# Patient Record
Sex: Male | Born: 1985 | Race: White | Hispanic: No | Marital: Married | Smoking: Current every day smoker
Health system: Southern US, Community
[De-identification: ages and names within clinical notes are randomized; demographics above are authoritative.]

## PROBLEM LIST (undated history)

## (undated) HISTORY — PX: MANDIBLE SURGERY: SHX707

## (undated) HISTORY — PX: TONSILLECTOMY: SUR1361

---
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
## 2011-03-30 ENCOUNTER — Emergency Department: Payer: Self-pay | Admitting: Emergency Medicine
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
## 2014-02-18 ENCOUNTER — Emergency Department: Payer: Self-pay | Admitting: Emergency Medicine

## 2014-03-15 ENCOUNTER — Emergency Department: Payer: Self-pay | Admitting: Emergency Medicine

## 2014-07-08 ENCOUNTER — Emergency Department: Payer: Self-pay | Admitting: Emergency Medicine

## 2014-07-09 ENCOUNTER — Emergency Department: Payer: Self-pay | Admitting: Internal Medicine

## 2014-08-13 ENCOUNTER — Emergency Department: Payer: Self-pay | Admitting: Emergency Medicine

## 2015-07-17 ENCOUNTER — Emergency Department
Admission: EM | Admit: 2015-07-17 | Discharge: 2015-07-18 | Disposition: A | Discharge status: Home / Self Care | Payer: Medicaid Other | Attending: Emergency Medicine | Admitting: Emergency Medicine

## 2015-07-17 ENCOUNTER — Emergency Department: Payer: Medicaid Other

## 2015-07-17 DIAGNOSIS — Z720 Tobacco use: Secondary | ICD-10-CM | POA: Diagnosis not present

## 2015-07-17 DIAGNOSIS — W1839XA Other fall on same level, initial encounter: Secondary | ICD-10-CM | POA: Insufficient documentation

## 2015-07-17 DIAGNOSIS — Z880 Allergy status to penicillin: Secondary | ICD-10-CM | POA: Diagnosis not present

## 2015-07-17 DIAGNOSIS — Y9361 Activity, american tackle football: Secondary | ICD-10-CM | POA: Diagnosis not present

## 2015-07-17 DIAGNOSIS — Y998 Other external cause status: Secondary | ICD-10-CM | POA: Insufficient documentation

## 2015-07-17 DIAGNOSIS — S93401A Sprain of unspecified ligament of right ankle, initial encounter: Secondary | ICD-10-CM | POA: Diagnosis not present

## 2015-07-17 DIAGNOSIS — Y92321 Football field as the place of occurrence of the external cause: Secondary | ICD-10-CM | POA: Insufficient documentation

## 2015-07-17 DIAGNOSIS — S99911A Unspecified injury of right ankle, initial encounter: Secondary | ICD-10-CM | POA: Diagnosis present

## 2015-07-17 MED ORDER — OXYCODONE-ACETAMINOPHEN 5-325 MG PO TABS
1.0000 | ORAL_TABLET | Freq: Once | ORAL | Status: AC
  Administered 2015-07-17: 1 via ORAL

## 2015-07-17 MED ORDER — OXYCODONE-ACETAMINOPHEN 5-325 MG PO TABS: 1.0000 | ORAL_TABLET | Freq: Four times a day (QID) | ORAL | Status: AC | PRN

## 2015-07-17 MED ORDER — OXYCODONE-ACETAMINOPHEN 5-325 MG PO TABS
ORAL_TABLET | ORAL | Status: AC
  Administered 2015-07-17: 1 via ORAL
  Filled 2015-07-17: qty 1

## 2015-07-17 MED ORDER — IBUPROFEN 800 MG PO TABS
800.0000 mg | ORAL_TABLET | Freq: Once | ORAL | Status: AC
  Administered 2015-07-17: 800 mg via ORAL
  Filled 2015-07-17: qty 1

## 2015-07-17 MED ORDER — NAPROXEN 500 MG PO TABS
500.0000 mg | ORAL_TABLET | Freq: Two times a day (BID) | ORAL | Status: AC | PRN
Start: 2015-07-17 — End: 2016-07-16

## 2015-07-17 NOTE — ED Provider Notes (Signed)
[facility] Emergency Department Provider Note  ____________________________________________  Time seen: Seen upon arrival to the emergency department.  I have reviewed the triage vital signs and the nursing notes.   HISTORY  Chief Complaint Ankle Injury    HPI Kevin Duarte is a 29 y.o. male without any chronic medical conditions who presents today after inverting and then everting his right ankle while playing football. He reports pain to the ankle which is greatest to the lateral aspect. He did not hit his head or lose consciousness when he fell. He does admit to drinking 2x40 ounce drinks prior to arrival this evening.   History reviewed. No pertinent past medical history.  There are no active problems to display for this patient.   Past Surgical History  Procedure Laterality Date  . Mandible surgery      Left Mandible    No current outpatient prescriptions on file.  Allergies Amoxicillin and Penicillins  History reviewed. No pertinent family history.  Social History History  Substance Use Topics  . Smoking status: Current Every Day Smoker  . Smokeless tobacco: Never Used  . Alcohol Use: Yes    Review of Systems Constitutional: No fever/chills Eyes: No visual changes. ENT: No sore throat. Cardiovascular: Denies chest pain. Respiratory: Denies shortness of breath. Gastrointestinal: No abdominal pain.  No nausea, no vomiting.  No diarrhea.  No constipation. Genitourinary: Negative for dysuria. Musculoskeletal: Right ankle pain  Skin: Negative for rash. Neurological: Negative for headaches, focal weakness or numbness.  10-point ROS otherwise negative.  ____________________________________________   PHYSICAL EXAM:  VITAL SIGNS: ED Triage Vitals  Enc Vitals Group     BP 07/17/15 2200 141/101 mmHg     Pulse Rate 07/17/15 2200 114     Resp 07/17/15 2200 21     Temp --      Temp Source 07/17/15 2200 Oral     SpO2 07/17/15 2200  96 %     Weight --      Height --      Head Cir --      Peak Flow --      Pain Score 07/17/15 2201 7     Pain Loc --      Pain Edu? --      Excl. in GC? --     Constitutional: Alert and oriented. Well appearing and in no acute distress. Eyes: Conjunctivae are normal. PERRL. EOMI. Head: Atraumatic. Nose: No congestion/rhinnorhea. Mouth/Throat: Mucous membranes are moist.  Oropharynx non-erythematous. Neck: No stridor.   Cardiovascular: Normal rate, regular rhythm. Grossly normal heart sounds.  Good peripheral circulation. Respiratory: Normal respiratory effort.  No retractions. Lungs CTAB. Gastrointestinal: Soft and nontender. No distention. No abdominal bruits. No CVA tenderness. Musculoskeletal: Right lateral ankle with swelling over the lateral malleolus. Patient does have pain with active range of motion.  No joint effusions. No tenderness to the bones of the foot. Able to range his toes. Dorsalis pedis pulses intact. Sensation intact. Brisk capillary refill distal to the injury. Neurologic:  Normal speech and language. No gross focal neurologic deficits are appreciated. No gait instability. Skin:  Skin is warm, dry and intact. No rash noted. Psychiatric: Mood and affect are normal. Speech and behavior are normal.  ____________________________________________   LABS (all labs ordered are listed, but only abnormal results are displayed)  Labs Reviewed - No data to display ____________________________________________  EKG   ____________________________________________  RADIOLOGY  Negative for fracture. ____________________________________________   PROCEDURES    ____________________________________________  INITIAL IMPRESSION / ASSESSMENT AND PLAN / ED COURSE  Pertinent labs & imaging results that were available during my care of the patient were reviewed by me and considered in my medical decision making (see chart for  details).  ----------------------------------------- 11:18 PM on 07/17/2015 -----------------------------------------  Patient resting comfortably now after ibuprofen and Percocet. Discussed the radiology results with the patient. Understands that he does not have a fracture. Understands that he will keep the extremity iced and elevated. We will give him a splint because of the pain from the fracture. We will also give him crutches to go home with. He is not clinically intoxicated and appropriate for discharge. ____________________________________________   FINAL CLINICAL IMPRESSION(S) / ED DIAGNOSES  Acute right ankle sprain. Initial visit.    Myrna Blazer, MD 07/17/15 [phone]

## 2015-07-17 NOTE — Discharge Instructions (Signed)
Ankle Sprain  An ankle sprain is an injury to the strong, fibrous tissues (ligaments) that hold your ankle bones together.   HOME CARE   · Put ice on your ankle for 1-2 days or as told by your doctor.  ¨ Put ice in a plastic bag.  ¨ Place a towel between your skin and the bag.  ¨ Leave the ice on for 15-20 minutes at a time, every 2 hours while you are awake.  · Only take medicine as told by your doctor.  · Raise (elevate) your injured ankle above the level of your heart as much as possible for 2-3 days.  · Use crutches if your doctor tells you to. Slowly put your own weight on the affected ankle. Use the crutches until you can walk without pain.  · If you have a plaster splint:  ¨ Do not rest it on anything harder than a pillow for 24 hours.  ¨ Do not put weight on it.  ¨ Do not get it wet.  ¨ Take it off to shower or bathe.  · If given, use an elastic wrap or support stocking for support. Take the wrap off if your toes lose feeling (numb), tingle, or turn cold or blue.  · If you have an air splint:  ¨ Add or let out air to make it comfortable.  ¨ Take it off at night and to shower and bathe.  ¨ Wiggle your toes and move your ankle up and down often while you are wearing it.  GET HELP IF:  · You have rapidly increasing bruising or puffiness (swelling).  · Your toes feel very cold.  · You lose feeling in your foot.  · Your medicine does not help your pain.  GET HELP RIGHT AWAY IF:   · Your toes lose feeling (numb) or turn blue.  · You have severe pain that is increasing.  MAKE SURE YOU:   · Understand these instructions.  · Will watch your condition.  · Will get help right away if you are not doing well or get worse.  Document Released: 05/20/2008 Document Revised: 04/18/2014 Document Reviewed: 06/15/2012  [employer]® Patient Information ©2015 [employer]. This information is not intended to replace advice given to you by your health care provider. Make sure you discuss any questions you have with your health care  provider.

## 2015-07-17 NOTE — ED Notes (Signed)
Ice Pack is place on Right Ankle and elevated on two pillows.

## 2015-07-17 NOTE — ED Notes (Signed)
According to pt, he was playing Football outside when he "rolled" his right ankle. Upon arrival to ED, obvious right ankle deformity observed. Pt A+OX4 and rates right ankle pain at 7/10.

## 2016-05-10 ENCOUNTER — Encounter: Payer: Self-pay | Admitting: Emergency Medicine

## 2016-05-10 DIAGNOSIS — F1219 Cannabis abuse with unspecified cannabis-induced disorder: Secondary | ICD-10-CM | POA: Diagnosis not present

## 2016-05-10 DIAGNOSIS — F17210 Nicotine dependence, cigarettes, uncomplicated: Secondary | ICD-10-CM | POA: Diagnosis not present

## 2016-05-10 DIAGNOSIS — R45851 Suicidal ideations: Secondary | ICD-10-CM | POA: Diagnosis present

## 2016-05-10 DIAGNOSIS — R457 State of emotional shock and stress, unspecified: Secondary | ICD-10-CM | POA: Insufficient documentation

## 2016-05-10 DIAGNOSIS — F329 Major depressive disorder, single episode, unspecified: Secondary | ICD-10-CM | POA: Insufficient documentation

## 2016-05-10 NOTE — ED Notes (Signed)
Pt presents to ED with suicidal thoughts (no plan). Previous hx of suicide attempt several years ago. Has been "batteling suicidal thoughts for many years" but the past 3 days it has become worse. Pt states he was seen by pcp but was not given any resources for help. Pt feels like his doctor just overlooked him and his problems because she has so many people to take care of.

## 2016-05-11 ENCOUNTER — Emergency Department
Admission: EM | Admit: 2016-05-11 | Discharge: 2016-05-11 | Disposition: A | Discharge status: Home / Self Care | Payer: Medicaid Other | Attending: Emergency Medicine | Admitting: Emergency Medicine

## 2016-05-11 DIAGNOSIS — F32A Depression, unspecified: Secondary | ICD-10-CM

## 2016-05-11 DIAGNOSIS — F329 Major depressive disorder, single episode, unspecified: Secondary | ICD-10-CM

## 2016-05-11 DIAGNOSIS — F4320 Adjustment disorder, unspecified: Secondary | ICD-10-CM

## 2016-05-11 LAB — CBC WITH DIFFERENTIAL/PLATELET
Basophils Absolute: 0.1 10*3/uL (ref 0–0.1)
Basophils Relative: 1 %
EOS ABS: 0.2 10*3/uL (ref 0–0.7)
Eosinophils Relative: 2 %
HCT: 51.4 % (ref 40.0–52.0)
HEMOGLOBIN: 17.9 g/dL (ref 13.0–18.0)
Lymphocytes Relative: 40 %
Lymphs Abs: 4 10*3/uL — ABNORMAL HIGH (ref 1.0–3.6)
MCH: 33.4 pg (ref 26.0–34.0)
MCHC: 34.8 g/dL (ref 32.0–36.0)
MCV: 96 fL (ref 80.0–100.0)
MONO ABS: 0.7 10*3/uL (ref 0.2–1.0)
MONOS PCT: 7 %
NEUTROS ABS: 4.9 10*3/uL (ref 1.4–6.5)
Neutrophils Relative %: 50 %
Platelets: 187 10*3/uL (ref 150–440)
RBC: 5.36 MIL/uL (ref 4.40–5.90)
RDW: 13.5 % (ref 11.5–14.5)
WBC: 10 10*3/uL (ref 3.8–10.6)

## 2016-05-11 LAB — COMPREHENSIVE METABOLIC PANEL
ALK PHOS: 56 U/L (ref 38–126)
ALT: 30 U/L (ref 17–63)
ANION GAP: 11 (ref 5–15)
AST: 25 U/L (ref 15–41)
Albumin: 4.9 g/dL (ref 3.5–5.0)
BUN: 11 mg/dL (ref 6–20)
CALCIUM: 9.3 mg/dL (ref 8.9–10.3)
CHLORIDE: 104 mmol/L (ref 101–111)
CO2: 25 mmol/L (ref 22–32)
Creatinine, Ser: 0.74 mg/dL (ref 0.61–1.24)
GFR calc non Af Amer: >60 mL/min (ref >60)
GLUCOSE: 99 mg/dL (ref 65–99)
POTASSIUM: 3.2 mmol/L — AB (ref 3.5–5.1)
SODIUM: 140 mmol/L (ref 135–145)
Total Bilirubin: 0.8 mg/dL (ref 0.3–1.2)
Total Protein: 7.7 g/dL (ref 6.5–8.1)

## 2016-05-11 LAB — URINE DRUG SCREEN, QUALITATIVE (ARMC ONLY)
Amphetamines, Ur Screen: NOT DETECTED
BARBITURATES, UR SCREEN: NOT DETECTED
Benzodiazepine, Ur Scrn: NOT DETECTED
COCAINE METABOLITE, UR ~~LOC~~: NOT DETECTED
Cannabinoid 50 Ng, Ur ~~LOC~~: NOT DETECTED
MDMA (Ecstasy)Ur Screen: NOT DETECTED
Methadone Scn, Ur: NOT DETECTED
OPIATE, UR SCREEN: NOT DETECTED
Phencyclidine (PCP) Ur S: NOT DETECTED
Tricyclic, Ur Screen: NOT DETECTED

## 2016-05-11 LAB — ETHANOL: Alcohol, Ethyl (B): 184 mg/dL — ABNORMAL HIGH (ref <5)

## 2016-05-11 LAB — ACETAMINOPHEN LEVEL: Acetaminophen (Tylenol), Serum: <10 ug/mL — ABNORMAL LOW (ref 10–30)

## 2016-05-11 LAB — SALICYLATE LEVEL: Salicylate Lvl: <4 mg/dL (ref 2.8–30.0)
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
## 2016-05-11 NOTE — Discharge Instructions (Signed)
Major Depressive Disorder °Major depressive disorder is a mental illness. It also may be called clinical depression or unipolar depression. Major depressive disorder usually causes feelings of sadness, hopelessness, or helplessness. Some people with this disorder do not feel particularly sad but lose interest in doing things they used to enjoy (anhedonia). Major depressive disorder also can cause physical symptoms. It can interfere with work, school, relationships, and other normal everyday activities. The disorder varies in severity but is longer lasting and more serious than the sadness we all feel from time to time in our lives. °Major depressive disorder often is triggered by stressful life events or major life changes. Examples of these triggers include divorce, loss of your job or home, a move, and the death of a family member or close friend. Sometimes this disorder occurs for no obvious reason at all. People who have family members with major depressive disorder or bipolar disorder are at higher risk for developing this disorder, with or without life stressors. Major depressive disorder can occur at any age. It may occur just once in your life (single episode major depressive disorder). It may occur multiple times (recurrent major depressive disorder). °SYMPTOMS °People with major depressive disorder have either anhedonia or depressed mood on nearly a daily basis for at least 2 weeks or longer. Symptoms of depressed mood include: °· Feelings of sadness (blue or down in the dumps) or emptiness. °· Feelings of hopelessness or helplessness. °· Tearfulness or episodes of crying (may be observed by others). °· Irritability (children and adolescents). °In addition to depressed mood or anhedonia or both, people with this disorder have at least four of the following symptoms: °· Difficulty sleeping or sleeping too much.   °· Significant change (increase or decrease) in appetite or weight.   °· Lack of energy or  motivation. °· Feelings of guilt and worthlessness.   °· Difficulty concentrating, remembering, or making decisions. °· Unusually slow movement (psychomotor retardation) or restlessness (as observed by others).   °· Recurrent wishes for death, recurrent thoughts of self-harm (suicide), or a suicide attempt. °People with major depressive disorder commonly have persistent negative thoughts about themselves, other people, and the world. People with severe major depressive disorder may experience distorted beliefs or perceptions about the world (psychotic delusions). They also may see or hear things that are not real (psychotic hallucinations). °DIAGNOSIS °Major depressive disorder is diagnosed through an assessment by your health care provider. Your health care provider will ask about aspects of your daily life, such as mood, sleep, and appetite, to see if you have the diagnostic symptoms of major depressive disorder. Your health care provider may ask about your medical history and use of alcohol or drugs, including prescription medicines. Your health care provider also may do a physical exam and blood work. This is because certain medical conditions and the use of certain substances can cause major depressive disorder-like symptoms (secondary depression). Your health care provider also may refer you to a mental health specialist for further evaluation and treatment. °TREATMENT °It is important to recognize the symptoms of major depressive disorder and seek treatment. The following treatments can be prescribed for this disorder:   °· Medicine. Antidepressant medicines usually are prescribed. Antidepressant medicines are thought to correct chemical imbalances in the brain that are commonly associated with major depressive disorder. Other types of medicine may be added if the symptoms do not respond to antidepressant medicines alone or if psychotic delusions or hallucinations occur. °· Talk therapy. Talk therapy can be  helpful in treating major depressive disorder by providing   support, education, and guidance. Certain types of talk therapy also can help with negative thinking (cognitive behavioral therapy) and with relationship issues that trigger this disorder (interpersonal therapy). A mental health specialist can help determine which treatment is best for you. Most people with major depressive disorder do well with a combination of medicine and talk therapy. Treatments involving electrical stimulation of the brain can be used in situations with extremely severe symptoms or when medicine and talk therapy do not work over time. These treatments include electroconvulsive therapy, transcranial magnetic stimulation, and vagal nerve stimulation.   This information is not intended to replace advice given to you by your health care provider. Make sure you discuss any questions you have with your health care provider.   Document Released: 03/29/2013 Document Revised: 12/23/2014 Document Reviewed: 03/29/2013 Elsevier Interactive Patient Education 2016 [employer].  Suicidal Feelings: How to Help Yourself Suicide is the taking of one's own life. If you feel as though life is getting too tough to handle and are thinking about suicide, get help right away. To get help:  Call your local emergency services (911 in the [place]  Call a suicide hotline to speak with a trained counselor who understands how you are feeling. The following is a list of suicide hotlines in the [place]. For a list of hotlines in [place], visit InkDistributor.it.  1-800-273-TALK 401-084-4920).  1-800-SUICIDE 781-546-5187).  580 608 6789. This is a hotline for Spanish speakers.  4-132-440-1UUV 239-726-8785). This is a hotline for TTY users.  1-866-4-U-TREVOR (972)835-3241). This is a hotline for lesbian, gay, bisexual, transgender, or questioning youth.  Contact a crisis  center or a local suicide prevention center. To find a crisis center or suicide prevention center:  Call your local hospital, clinic, community service organization, mental health center, social service provider, or health department. Ask for assistance in connecting to a crisis center.  Visit https://www.patel-king.com/ for a list of crisis centers in the [place], or visit www.suicideprevention.ca/thinking-about-suicide/find-a-crisis-centre for a list of centers in [place].  Visit the following websites:  National Suicide Prevention Lifeline: www.suicidepreventionlifeline.org  Hopeline: www.hopeline.com  [employer] for Suicide Prevention: https://www.ayers.com/  [employer] (for lesbian, gay, bisexual, transgender, or questioning youth): www.thetrevorproject.org HOW CAN I HELP MYSELF FEEL BETTER?  Promise yourself that you will not do anything drastic when you have suicidal feelings. Remember, there is hope. Many people have gotten through suicidal thoughts and feelings, and you will, too. You may have gotten through them before, and this proves that you can get through them again.  Let family, friends, teachers, or counselors know how you are feeling. Try not to isolate yourself from those who care about you. Remember, they will want to help you. Talk with someone every day, even if you do not feel sociable. Face-to-face conversation is best.  Call a mental health professional and see one regularly.  Visit your primary health care provider every year.  Eat a well-balanced diet, and space your meals so you eat regularly.  Get plenty of rest.  Avoid alcohol and drugs, and remove them from your home. They will only make you feel worse.  If you are thinking of taking a lot of medicine, give your medicine to someone who can give it to you one day at a time. If you are on antidepressants and are concerned you will overdose, let your health care  provider know so he or she can give you safer medicines. Ask your mental health professional about the possible side effects of any  medicines you are taking.  Remove weapons, poisons, knives, and anything else that could harm you from your home.  Try to stick to routines. Follow a schedule every day. Put self-care on your schedule.  Make a list of realistic goals, and cross them off when you achieve them. Accomplishments give a sense of worth.  Wait until you are feeling better before doing the things you find difficult or unpleasant.  Exercise if you are able. You will feel better if you exercise for even a half hour each day.  Go out in the sun or into nature. This will help you recover from depression faster. If you have a favorite place to walk, go there.  Do the things that have always given you pleasure. Play your favorite music, read a good book, paint a picture, play your favorite instrument, or do anything else that takes your mind off your depression if it is safe to do.  Keep your living space well lit.  When you are feeling well, write yourself a letter about tips and support that you can read when you are not feeling well.  Remember that life's difficulties can be sorted out with help. Conditions can be treated. You can work on thoughts and strategies that serve you well.   This information is not intended to replace advice given to you by your health care provider. Make sure you discuss any questions you have with your health care provider.   Document Released: 06/08/2003 Document Revised: 12/23/2014 Document Reviewed: 03/29/2014 Elsevier Interactive Patient Education 2016 [employer]  Adjustment Disorder Adjustment disorder is an unusually severe reaction to a stressful life event, such as the loss of a job or physical illness. The event may be any stressful event other than the loss of a loved one. Adjustment disorder may affect your feelings, your thinking, how you  act, or a combination of these. It may interfere with personal relationships or with the way you are at work, school, or home. People with this disorder are at risk for suicide and substance abuse. They may develop a more serious mental disorder, such as major depressive disorder or post-traumatic stress disorder. SIGNS AND SYMPTOMS  Symptoms may include:  Sadness, depressed mood, or crying spells.  Loss of enjoyment.  Change in appetite or weight.  Sense of loss or hopelessness.  Thoughts of suicide.  Anxiety, worry, or nervousness.  Trouble sleeping.  Avoiding family and friends.  Poor school performance.  Fighting or vandalism.  Reckless driving.  Skipping school.  Poor work International aid/development workerperformance.  Ignoring bills. Symptoms of adjustment disorder start within 3 months of the stressful life event. They do not last more than 6 months after the event has ended. DIAGNOSIS  To make a diagnosis, your health care provider will ask about what has happened in your life and how it has affected you. He or she may also ask about your medical history and use of medicines, alcohol, and other substances. Your health care provider may do a physical exam and order lab tests or other studies. You may be referred to a mental health specialist for evaluation. TREATMENT  Treatment options include:  Counseling or talk therapy. Talk therapy is usually provided by mental health specialists.  Medicine. Certain medicines may help with depression, anxiety, and sleep.  Support groups. Support groups offer emotional support, advice, and guidance. They are made up of people who have had similar experiences. HOME CARE INSTRUCTIONS  Keep all follow-up visits as directed by your health care  provider. This is important.  Take medicines only as directed by your health care provider. SEEK MEDICAL CARE IF:  Your symptoms get worse.  SEEK IMMEDIATE MEDICAL CARE IF: You have serious thoughts about hurting  yourself or someone else. MAKE SURE YOU:  Understand these instructions.  Will watch your condition.  Will get help right away if you are not doing well or get worse.   This information is not intended to replace advice given to you by your health care provider. Make sure you discuss any questions you have with your health care provider.   Document Released: 08/06/2006 Document Revised: 12/23/2014 Document Reviewed: 04/26/2014 Elsevier Interactive Patient Education [employer]

## 2016-05-11 NOTE — BH Assessment (Signed)
Assessment Note  Kevin Duarte is an 30 y.o. male presents to the ED voluntarily for suicidal thoughts with no intent or plan.  Pt reports feeling stressed because he has "alot of responsibilities and things on his mind".  He reports feeling stressed about not having a job and no means to take care of his family.  He reports that all four of kids have special needs.  He reports never having closure with his parents deaths.   He denies having HI and any auditory/visual hallucinations.  He states that he does use marijuana and drinks alcohol.  Pt states he is primarily here for resources for outpatient therapy.  Diagnosis: Depression  Past Medical History: History reviewed. No pertinent past medical history.  Past Surgical History  Procedure Laterality Date  . Mandible surgery      Left Mandible  . Tonsillectomy      Family History: No family history on file.  Social History:  reports that he has been smoking Cigarettes.  He has been smoking about 0.50 packs per day. He has quit using smokeless tobacco. He reports that he drinks alcohol. He reports that he uses illicit drugs (Marijuana and Cocaine).  Additional Social History:  Alcohol / Drug Use History of alcohol / drug use?: Yes (Pt reports a history of alcohol and marijuana use.)  CIWA: CIWA-Ar BP: 139/89 mmHg Pulse Rate: (!) 104 COWS:    Allergies:  Allergies  Allergen Reactions  . Amoxicillin Other (See Comments)    reaction: unknown   . Penicillins Other (See Comments)    Reaction: unknown     Home Medications:  (Not in a hospital admission)  OB/GYN Status:  No LMP for male patient.  General Assessment Data Location of Assessment: [facility] ED TTS Assessment: In system Is this a Tele or Face-to-Face Assessment?: Face-to-Face Is this an Initial Assessment or a Re-assessment for this encounter?: Initial Assessment Marital status: Married [place] name: N/A Is patient pregnant?: No Pregnancy Status: No Living  Arrangements: Spouse/significant other Can pt return to current living arrangement?: Yes Admission Status: Voluntary Is patient capable of signing voluntary admission?: Yes Referral Source: Self/Family/Friend Insurance type: Medicaid  Medical Screening Exam [facility] Walk-in ONLY) Medical Exam completed: Yes  Crisis Care Plan Living Arrangements: Spouse/significant other Legal Guardian: Other: (self) Name of Psychiatrist: None reported Name of Therapist: None reported  Education Status Is patient currently in school?: No Current Grade: N/A Highest grade of school patient has completed: N/A Name of school: N/A Contact person: N/A  Risk to self with the past 6 months Suicidal Ideation: Yes-Currently Present Has patient been a risk to self within the past 6 months prior to admission? : No Suicidal Intent: No Has patient had any suicidal intent within the past 6 months prior to admission? : No Is patient at risk for suicide?: No Suicidal Plan?: No Has patient had any suicidal plan within the past 6 months prior to admission? : No Access to Means: No What has been your use of drugs/alcohol within the last 12 months?: Alcohol and marijuana Previous Attempts/Gestures: No How many times?: 0 Other Self Harm Risks: None identified Triggers for Past Attempts: None known Intentional Self Injurious Behavior: Cutting Comment - Self Injurious Behavior: Prior history of cutting Family Suicide History: No Recent stressful life event(s): Financial Problems, Conflict (Comment) (conflict with others) Persecutory voices/beliefs?: No Depression: Yes Depression Symptoms: Loss of interest in usual pleasures, Feeling worthless/self pity Substance abuse history and/or treatment for substance abuse?: No Suicide prevention information  given to non-admitted patients: Not applicable  Risk to Others within the past 6 months Homicidal Ideation: No Does patient have any lifetime risk of violence toward  others beyond the six months prior to admission? : No Thoughts of Harm to Others: No Current Homicidal Intent: No Current Homicidal Plan: No Access to Homicidal Means: No Identified Victim: None identified History of harm to others?: No Assessment of Violence: None Noted Violent Behavior Description: None identified Does patient have access to weapons?: No Criminal Charges Pending?: No Does patient have a court date: No Is patient on probation?: No  Psychosis Hallucinations: None noted Delusions: None noted  Mental Status Report Appearance/Hygiene: In scrubs Eye Contact: Good Motor Activity: Freedom of movement Speech: Logical/coherent Level of Consciousness: Alert Mood: Depressed, Pleasant Affect: Appropriate to circumstance Anxiety Level: None Thought Processes: Coherent, Relevant Judgement: Partial Orientation: Person, Place, Time, Situation Obsessive Compulsive Thoughts/Behaviors: None  Cognitive Functioning Concentration: Normal Memory: Recent Intact, Remote Intact IQ: Average Insight: Fair Impulse Control: Fair Appetite: Good Weight Loss: 0 Weight Gain: 0 Sleep: No Change Total Hours of Sleep: 6 Vegetative Symptoms: None  ADLScreening [facility]) Patient's cognitive ability adequate to safely complete daily activities?: Yes Patient able to express need for assistance with ADLs?: Yes Independently performs ADLs?: Yes (appropriate for developmental age)  Prior Inpatient Therapy Prior Inpatient Therapy: No Prior Therapy Dates: N/A Prior Therapy Facilty/Provider(s): N/A Reason for Treatment: N/A  Prior Outpatient Therapy Prior Outpatient Therapy: No Prior Therapy Dates: N/A Prior Therapy Facilty/Provider(s): N/A Reason for Treatment: N/A Does patient have an ACCT team?: No Does patient have Intensive In-House Services?  : No Does patient have [facility] services? : No Does patient have P4CC services?: No  ADL Screening (condition at time  of admission) Patient's cognitive ability adequate to safely complete daily activities?: Yes Patient able to express need for assistance with ADLs?: Yes Independently performs ADLs?: Yes (appropriate for developmental age)       Abuse/Neglect Assessment (Assessment to be complete while patient is alone) Physical Abuse: Denies Verbal Abuse: Denies Sexual Abuse: Denies Exploitation of patient/patient's resources: Denies Self-Neglect: Denies Values / Beliefs Cultural Requests During Hospitalization: None Spiritual Requests During Hospitalization: None Consults Spiritual Care Consult Needed: No Social Work Consult Needed: No Merchant navy officerAdvance Directives (For Healthcare) Does patient have an advance directive?: No    Additional Information 1:1 In Past 12 Months?: No CIRT Risk: No Elopement Risk: No Does patient have medical clearance?: Yes     Disposition:  Disposition Initial Assessment Completed for this Encounter: Yes Disposition of Patient: Outpatient treatment Type of outpatient treatment: Adult (Pt desires resources for outpatient therapy)  On Site Evaluation by:   Reviewed with Physician:    Artist Beachoxana C Jessiah Steinhart 05/11/2016 3:20 AM

## 2016-08-13 ENCOUNTER — Emergency Department
Admission: EM | Admit: 2016-08-13 | Discharge: 2016-08-13 | Disposition: A | Discharge status: Home / Self Care | Payer: Medicaid Other | Attending: Emergency Medicine | Admitting: Emergency Medicine

## 2016-08-13 ENCOUNTER — Encounter: Payer: Self-pay | Admitting: *Deleted

## 2016-08-13 DIAGNOSIS — S61519A Laceration without foreign body of unspecified wrist, initial encounter: Secondary | ICD-10-CM

## 2016-08-13 DIAGNOSIS — F10920 Alcohol use, unspecified with intoxication, uncomplicated: Secondary | ICD-10-CM

## 2016-08-13 DIAGNOSIS — Z79899 Other long term (current) drug therapy: Secondary | ICD-10-CM | POA: Diagnosis not present

## 2016-08-13 DIAGNOSIS — F329 Major depressive disorder, single episode, unspecified: Secondary | ICD-10-CM

## 2016-08-13 DIAGNOSIS — S59911A Unspecified injury of right forearm, initial encounter: Secondary | ICD-10-CM | POA: Diagnosis present

## 2016-08-13 DIAGNOSIS — Y939 Activity, unspecified: Secondary | ICD-10-CM | POA: Insufficient documentation

## 2016-08-13 DIAGNOSIS — F1010 Alcohol abuse, uncomplicated: Secondary | ICD-10-CM

## 2016-08-13 DIAGNOSIS — F10120 Alcohol abuse with intoxication, uncomplicated: Secondary | ICD-10-CM | POA: Diagnosis not present

## 2016-08-13 DIAGNOSIS — Y929 Unspecified place or not applicable: Secondary | ICD-10-CM | POA: Diagnosis not present

## 2016-08-13 DIAGNOSIS — F17210 Nicotine dependence, cigarettes, uncomplicated: Secondary | ICD-10-CM | POA: Diagnosis not present

## 2016-08-13 DIAGNOSIS — W268XXA Contact with other sharp object(s), not elsewhere classified, initial encounter: Secondary | ICD-10-CM | POA: Insufficient documentation

## 2016-08-13 DIAGNOSIS — Y999 Unspecified external cause status: Secondary | ICD-10-CM | POA: Diagnosis not present

## 2016-08-13 DIAGNOSIS — S51811A Laceration without foreign body of right forearm, initial encounter: Secondary | ICD-10-CM | POA: Diagnosis not present

## 2016-08-13 DIAGNOSIS — F1994 Other psychoactive substance use, unspecified with psychoactive substance-induced mood disorder: Secondary | ICD-10-CM

## 2016-08-13 DIAGNOSIS — F32A Depression, unspecified: Secondary | ICD-10-CM

## 2016-08-13 LAB — COMPREHENSIVE METABOLIC PANEL
ALT: 30 U/L (ref 17–63)
AST: 25 U/L (ref 15–41)
Albumin: 5.1 g/dL — ABNORMAL HIGH (ref 3.5–5.0)
Alkaline Phosphatase: 52 U/L (ref 38–126)
Anion gap: 7 (ref 5–15)
BILIRUBIN TOTAL: 0.3 mg/dL (ref 0.3–1.2)
BUN: 9 mg/dL (ref 6–20)
CHLORIDE: 105 mmol/L (ref 101–111)
CO2: 30 mmol/L (ref 22–32)
Calcium: 9.5 mg/dL (ref 8.9–10.3)
Creatinine, Ser: 0.77 mg/dL (ref 0.61–1.24)
GFR calc Af Amer: >60 mL/min (ref >60)
Glucose, Bld: 108 mg/dL — ABNORMAL HIGH (ref 65–99)
Potassium: 4 mmol/L (ref 3.5–5.1)
Sodium: 142 mmol/L (ref 135–145)
TOTAL PROTEIN: 8 g/dL (ref 6.5–8.1)

## 2016-08-13 LAB — CBC
HEMATOCRIT: 49.7 % (ref 40.0–52.0)
Hemoglobin: 17.7 g/dL (ref 13.0–18.0)
MCH: 33.6 pg (ref 26.0–34.0)
MCHC: 35.5 g/dL (ref 32.0–36.0)
MCV: 94.7 fL (ref 80.0–100.0)
Platelets: 201 10*3/uL (ref 150–440)
RBC: 5.25 MIL/uL (ref 4.40–5.90)
RDW: 12.8 % (ref 11.5–14.5)
WBC: 8.6 10*3/uL (ref 3.8–10.6)

## 2016-08-13 LAB — SALICYLATE LEVEL: Salicylate Lvl: <4 mg/dL (ref 2.8–30.0)

## 2016-08-13 LAB — URINE DRUG SCREEN, QUALITATIVE (ARMC ONLY)
AMPHETAMINES, UR SCREEN: NOT DETECTED
Barbiturates, Ur Screen: NOT DETECTED
Benzodiazepine, Ur Scrn: NOT DETECTED
Cannabinoid 50 Ng, Ur ~~LOC~~: NOT DETECTED
Cocaine Metabolite,Ur ~~LOC~~: NOT DETECTED
MDMA (Ecstasy)Ur Screen: NOT DETECTED
Methadone Scn, Ur: NOT DETECTED
OPIATE, UR SCREEN: NOT DETECTED
PHENCYCLIDINE (PCP) UR S: NOT DETECTED
Tricyclic, Ur Screen: NOT DETECTED

## 2016-08-13 LAB — ACETAMINOPHEN LEVEL: Acetaminophen (Tylenol), Serum: <10 ug/mL — ABNORMAL LOW (ref 10–30)

## 2016-08-13 LAB — ETHANOL: Alcohol, Ethyl (B): 240 mg/dL — ABNORMAL HIGH (ref <5)

## 2016-08-13 NOTE — BH Assessment (Signed)
Per request of Psych MD (Dr. Clapacs), writer provided the patient with information and instructions on how to access Outpatient Mental Health & Substance Abuse Treatment ( ([facility] and [facility]).   Patient denies SI/HI and AV/H. 

## 2016-08-13 NOTE — ED Provider Notes (Signed)
[facility] Emergency Department Provider Note   ____________________________________________   First MD Initiated Contact with Patient 08/13/16 0246     (approximate)  I have reviewed the triage vital signs and the nursing notes.   HISTORY  Chief Complaint Behavior Problem  Limited by intoxication  HPI Kevin Duarte is a 30 y.o. male who presents voluntarily to the emergency room department, dropped off by police for alcohol intoxication and depression. States he was arguing with his wife and presents with superficial cuts to his right arm. Reportedly told the triage nurse he had SI earlier during the day.Currently denies active SI/HI/AH/VH. Remainder of history is limited secondary to patient's intoxication.   Past medical history None  There are no active problems to display for this patient.   Past Surgical History:  Procedure Laterality Date  . MANDIBLE SURGERY     Left Mandible  . TONSILLECTOMY      Prior to Admission medications   Medication Sig Start Date End Date Taking? Authorizing Provider  oxyCODONE-acetaminophen (ROXICET) 5-325 MG per tablet Take 1-2 tablets by mouth every 6 (six) hours as needed for severe pain. 07/17/15   Myrna Blazer, MD    Allergies Amoxicillin and Penicillins  No family history on file.  Social History Social History  Substance Use Topics  . Smoking status: Current Every Day Smoker    Packs/day: 0.50    Types: Cigarettes  . Smokeless tobacco: Former Neurosurgeon  . Alcohol use Yes    Review of Systems  Constitutional: No fever/chills. Eyes: No visual changes. ENT: No sore throat. Cardiovascular: Denies chest pain. Respiratory: Denies shortness of breath. Gastrointestinal: No abdominal pain.  No nausea, no vomiting.  No diarrhea.  No constipation. Genitourinary: Negative for dysuria. Musculoskeletal: Negative for back pain. Skin: Negative for rash. Neurological: Negative for headaches, focal  weakness or numbness. Psychiatric:Positive for depression.  10-point ROS otherwise negative.  ____________________________________________   PHYSICAL EXAM:  VITAL SIGNS: ED Triage Vitals  Enc Vitals Group     BP 08/13/16 0102 (!) 139/94     Pulse Rate 08/13/16 0102 (!) 110     Resp 08/13/16 0102 18     Temp 08/13/16 0102 98.2 F (36.8 C)     Temp Source 08/13/16 0102 Oral     SpO2 08/13/16 0102 98 %     Weight 08/13/16 0102 195 lb (88.5 kg)     Height 08/13/16 0102 6\' 1"  (1.854 m)     Head Circumference --      Peak Flow --      Pain Score 08/13/16 0113 2     Pain Loc --      Pain Edu? --      Excl. in GC? --     Constitutional: Asleep. Well appearing and in no acute distress. Intoxicated. Eyes: Conjunctivae are normal. PERRL. EOMI. Head: Atraumatic. Nose: No congestion/rhinnorhea. Mouth/Throat: Mucous membranes are moist.  Oropharynx non-erythematous. Neck: No stridor.  No cervical spine tenderness to palpation. Cardiovascular: Normal rate, regular rhythm. Grossly normal heart sounds.  Good peripheral circulation. Respiratory: Normal respiratory effort.  No retractions. Lungs CTAB. Gastrointestinal: Soft and nontender. No distention. No abdominal bruits. No CVA tenderness. Musculoskeletal: Superficial abrasions to right forearm. Neurologic: Intoxicated, arousable to voice. Normal speech and language. No gross focal neurologic deficits are appreciated.  Skin:  Skin is warm, dry and intact. No rash noted. Psychiatric: Mood and affect are normal. Speech and behavior are normal.  ____________________________________________   LABS (all labs  ordered are listed, but only abnormal results are displayed)  Labs Reviewed  COMPREHENSIVE METABOLIC PANEL - Abnormal; Notable for the following:       Result Value   Glucose, Bld 108 (*)    Albumin 5.1 (*)    All other components within normal limits  ETHANOL - Abnormal; Notable for the following:    Alcohol, Ethyl (B) 240  (*)    All other components within normal limits  ACETAMINOPHEN LEVEL - Abnormal; Notable for the following:    Acetaminophen (Tylenol), Serum <10 (*)    All other components within normal limits  SALICYLATE LEVEL  CBC  URINE DRUG SCREEN, QUALITATIVE ([facility] ONLY)   ____________________________________________  EKG  None ____________________________________________  RADIOLOGY  None ____________________________________________   PROCEDURES  Procedure(s) performed: None  Procedures  Critical Care performed: No  ____________________________________________   INITIAL IMPRESSION / ASSESSMENT AND PLAN / ED COURSE  Pertinent labs & imaging results that were available during my care of the patient were reviewed by me and considered in my medical decision making (see chart for details).  [age] year old male who presents to the ED intoxicated with superficial lacerations to his right forearm status post argument with wife. He is too intoxicated to participate in interview. He will remain in the ED on voluntary status pending TTS and psychiatry consults.  Clinical Course     ____________________________________________   FINAL CLINICAL IMPRESSION(S) / ED DIAGNOSES  Final diagnoses:  Alcohol intoxication, uncomplicated (HCC)  Depression      NEW MEDICATIONS STARTED DURING THIS VISIT:  New Prescriptions   No medications on file     Note:  This document was prepared using Dragon voice recognition software and may include unintentional dictation errors.    Irean HongJade J Loretto Belinsky, MD 08/13/16 [phone]

## 2016-08-13 NOTE — ED Notes (Signed)
He has called for a ride

## 2016-08-13 NOTE — ED Triage Notes (Signed)
Pt dropped off by bpd.  Pt reports he was arguing with his wife and pt has superficial cuts to right arm.   Pt admits to etoh use tonight.  Pt states SI earlier today.  Denies HI.  Pt calm and cooperative

## 2016-08-13 NOTE — ED Notes (Signed)
Pt. To BHU from ED ambulatory without difficulty, to room [facility]. [facility]. Pt. Is alert and oriented, warm and dry in no distress. Pt. Denies SI, HI, and AVH. Pt. Calm and cooperative. Pt. Made aware of security cameras and Q15 minute rounds. Pt has multiple superficial scratches on right forearm. Patient states, "I only did the longer ones on my arms the others are from the fence in my year from where I was trying to get my cat." When asked about previous self harm patient states, "I did it about 6 years ago when my wife passed away and I was left with my oldest daughter. I did not know what to do with a two year old with no help or support." Pt. Encouraged to let Nursing staff know of any concerns or needs.

## 2016-08-13 NOTE — Discharge Instructions (Signed)
Please follow up with [facility]-instructions ore on the other sheet of paper. Please return as needed.

## 2016-08-13 NOTE — ED Notes (Signed)

## 2016-08-13 NOTE — Consult Note (Signed)
[facility] Consult   Reason for Consult:  Consult for 30 year old man who came to the emergency room after cutting himself on the arm all intoxicated Referring Physician:  Edd Fabian Patient Identification: Kevin Duarte MRN:  [id number] Principal Diagnosis: Substance induced mood disorder [facility]) Diagnosis:   Patient Active Problem List   Diagnosis Date Noted  . Substance induced mood disorder ([place]) [F19.94] 08/13/2016  . Alcohol abuse [F10.10] 08/13/2016  . Self-inflicted laceration of wrist [S61.519A] 08/13/2016    Total Time spent with patient: 1 hour  Subjective:   Kevin Duarte is a 30 y.o. male patient admitted with "I got upset with my wife".  HPI:  Patient interviewed. Chart reviewed. Labs and vitals reviewed. 30 year old man came to the emergency room intoxicated after cutting himself very superficially on his arm. He says that last night he and his wife were drinking much more than usual. He reports that they both consumed about a 12 pack of year a piece. They got into an argument over something he doesn't even understand or remember at this point. He was feeling upset with her and cut himself very superficially on the forearm. Reports that he had no thought at all about wanting to die. Came to the emergency room to calm down and get away from the situation. Patient denies regular depression. Says that for the most part he sleeps okay. Denies appetite problems. Denies any suicidal ideation. Denies any psychotic symptoms. Currently not seeing anyone for any outpatient mental health treatment and not taking any psychiatric medicine. Patient reports that he drinks about 2 times a week usually much less than this. Denies that he is using any other drugs.  Social history: Married. Has 3 children at home. Patient does not work outside the home stays at home with the kids.  Medical history: Denies any significant known medical problems.  Substance abuse history: Says that he  drinks one or 2 times a week usually only a few beers and that it is unusual that he had a 12 pack last night. Denies that he is using any other drugs. Denies feeling like he has any kind of substance abuse problem. Never been involved in any kind of substance abuse treatment.  Past Psychiatric History: No history of inpatient psychiatric treatment. As a child he saw a therapist and reports that he was put on multiple medicines for behavior problems. He stopped them when he was an adolescent and hasn't had any follow-up psychiatric treatment since then. Denies any history of suicide attempts or violence.  Risk to Self: Suicidal Ideation: No-Not Currently/Within Last 6 Months Suicidal Intent: No-Not Currently/Within Last 6 Months Is patient at risk for suicide?: No Suicidal Plan?: No-Not Currently/Within Last 6 Months Access to Means: No What has been your use of drugs/alcohol within the last 12 months?: occasional use of alcohol (Bi weekly use) How many times?: 0 Other Self Harm Risks: denied Triggers for Past Attempts: Unknown Intentional Self Injurious Behavior: Cutting Risk to Others: Homicidal Ideation: No Thoughts of Harm to Others: No Current Homicidal Intent: No Current Homicidal Plan: No Access to Homicidal Means: No Identified Victim: None identified History of harm to others?: No Assessment of Violence: None Noted Violent Behavior Description: denied Does patient have access to weapons?: No Criminal Charges Pending?: No Does patient have a court date: No Prior Inpatient Therapy: Prior Inpatient Therapy: No Prior Therapy Dates: n/a Prior Therapy Facilty/Provider(s): n./a Reason for Treatment: n/a Prior Outpatient Therapy: Prior Outpatient Therapy: No Prior Therapy Dates:  n/a Prior Therapy Facilty/Provider(s): n/a Reason for Treatment: n/a Does patient have an ACCT team?: No Does patient have Intensive In-House Services?  : No Does patient have [facility] services? : No Does  patient have P4CC services?: No  Past Medical History: No past medical history on file.  Past Surgical History:  Procedure Laterality Date  . MANDIBLE SURGERY     Left Mandible  . TONSILLECTOMY     Family History: No family history on file. Family Psychiatric  History: Reports that his mother had an alcohol problem and also problems with depression. Social History:  History  Alcohol Use  . Yes     History  Drug Use  . Types: Marijuana, Cocaine    Social History   Social History  . Marital status: Married    Spouse name: N/A  . Number of children: N/A  . Years of education: N/A   Social History Main Topics  . Smoking status: Current Every Day Smoker    Packs/day: 0.50    Types: Cigarettes  . Smokeless tobacco: Former Systems developer  . Alcohol use Yes  . Drug use:     Types: Marijuana, Cocaine  . Sexual activity: No   Other Topics Concern  . None   Social History Narrative  . None   Additional Social History:    Allergies:   Allergies  Allergen Reactions  . Amoxicillin Other (See Comments)    reaction: unknown   . Penicillins Other (See Comments)    Reaction: unknown     Labs:  Results for orders placed or performed during the hospital encounter of 08/13/16 (from the past 48 hour(s))  Comprehensive metabolic panel     Status: Abnormal   Collection Time: 08/13/16  1:04 AM  Result Value Ref Range   Sodium 142 135 - 145 mmol/L   Potassium 4.0 3.5 - 5.1 mmol/L   Chloride 105 101 - 111 mmol/L   CO2 30 22 - 32 mmol/L   Glucose, Bld 108 (H) 65 - 99 mg/dL   BUN 9 6 - 20 mg/dL   Creatinine, Ser 0.77 0.61 - 1.24 mg/dL   Calcium 9.5 8.9 - 10.3 mg/dL   Total Protein 8.0 6.5 - 8.1 g/dL   Albumin 5.1 (H) 3.5 - 5.0 g/dL   AST 25 15 - 41 U/L   ALT 30 17 - 63 U/L   Alkaline Phosphatase 52 38 - 126 U/L   Total Bilirubin 0.3 0.3 - 1.2 mg/dL   GFR calc non Af Amer >60 >60 mL/min   GFR calc Af Amer >60 >60 mL/min    Comment: (NOTE) The eGFR has been calculated using the  CKD EPI equation. This calculation has not been validated in all clinical situations. eGFR's persistently <60 mL/min signify possible Chronic Kidney Disease.    Anion gap 7 5 - 15  Ethanol     Status: Abnormal   Collection Time: 08/13/16  1:04 AM  Result Value Ref Range   Alcohol, Ethyl (B) 240 (H) <5 mg/dL    Comment:        LOWEST DETECTABLE LIMIT FOR SERUM ALCOHOL IS 5 mg/dL FOR MEDICAL PURPOSES ONLY   Salicylate level     Status: None   Collection Time: 08/13/16  1:04 AM  Result Value Ref Range   Salicylate Lvl <1.4 2.8 - 30.0 mg/dL  Acetaminophen level     Status: Abnormal   Collection Time: 08/13/16  1:04 AM  Result Value Ref Range   Acetaminophen (Tylenol), Serum <10 (  L) 10 - 30 ug/mL    Comment:        THERAPEUTIC CONCENTRATIONS VARY SIGNIFICANTLY. A RANGE OF 10-30 ug/mL MAY BE AN EFFECTIVE CONCENTRATION FOR MANY PATIENTS. HOWEVER, SOME ARE BEST TREATED AT CONCENTRATIONS OUTSIDE THIS RANGE. ACETAMINOPHEN CONCENTRATIONS >150 ug/mL AT 4 HOURS AFTER INGESTION AND >50 ug/mL AT 12 HOURS AFTER INGESTION ARE OFTEN ASSOCIATED WITH TOXIC REACTIONS.   cbc     Status: None   Collection Time: 08/13/16  1:04 AM  Result Value Ref Range   WBC 8.6 3.8 - 10.6 K/uL   RBC 5.25 4.40 - 5.90 MIL/uL   Hemoglobin 17.7 13.0 - 18.0 g/dL    Comment: RESULT REPEATED AND VERIFIED   HCT 49.7 40.0 - 52.0 %   MCV 94.7 80.0 - 100.0 fL   MCH 33.6 26.0 - 34.0 pg   MCHC 35.5 32.0 - 36.0 g/dL   RDW 12.8 11.5 - 14.5 %   Platelets 201 150 - 440 K/uL  Urine Drug Screen, Qualitative     Status: None   Collection Time: 08/13/16  1:04 AM  Result Value Ref Range   Tricyclic, Ur Screen NONE DETECTED NONE DETECTED   Amphetamines, Ur Screen NONE DETECTED NONE DETECTED   MDMA (Ecstasy)Ur Screen NONE DETECTED NONE DETECTED   Cocaine Metabolite,Ur [place] NONE DETECTED NONE DETECTED   Opiate, Ur Screen NONE DETECTED NONE DETECTED   Phencyclidine (PCP) Ur S NONE DETECTED NONE DETECTED   Cannabinoid 50  Ng, Ur [place] NONE DETECTED NONE DETECTED   Barbiturates, Ur Screen NONE DETECTED NONE DETECTED   Benzodiazepine, Ur Scrn NONE DETECTED NONE DETECTED   Methadone Scn, Ur NONE DETECTED NONE DETECTED    Comment: (NOTE) 867  Tricyclics, urine               Cutoff 1000 ng/mL 200  Amphetamines, urine             Cutoff 1000 ng/mL 300  MDMA (Ecstasy), urine           Cutoff 500 ng/mL 400  Cocaine Metabolite, urine       Cutoff 300 ng/mL 500  Opiate, urine                   Cutoff 300 ng/mL 600  Phencyclidine (PCP), urine      Cutoff 25 ng/mL 700  Cannabinoid, urine              Cutoff 50 ng/mL 800  Barbiturates, urine             Cutoff 200 ng/mL 900  Benzodiazepine, urine           Cutoff 200 ng/mL 1000 Methadone, urine                Cutoff 300 ng/mL 1100 1200 The urine drug screen provides only a preliminary, unconfirmed 1300 analytical test result and should not be used for non-medical 1400 purposes. Clinical consideration and professional judgment should 1500 be applied to any positive drug screen result due to possible 1600 interfering substances. A more specific alternate chemical method 1700 must be used in order to obtain a confirmed analytical result.  1800 Gas chromato graphy / mass spectrometry (GC/MS) is the preferred 1900 confirmatory method.     No current facility-administered medications for this encounter.    Current Outpatient Prescriptions  Medication Sig Dispense Refill  . oxyCODONE-acetaminophen (ROXICET) 5-325 MG per tablet Take 1-2 tablets by mouth every 6 (six) hours as needed for severe pain.  5 tablet 0    Musculoskeletal: Strength & Muscle Tone: within normal limits Gait & Station: normal Patient leans: N/A  Psychiatric Specialty Exam: Physical Exam  Nursing note and vitals reviewed. Constitutional: He appears well-developed and well-nourished.  HENT:  Head: Normocephalic and atraumatic.  Eyes: Conjunctivae are normal. Pupils are equal, round, and  reactive to light.  Neck: Normal range of motion.  Cardiovascular: Regular rhythm and normal heart sounds.   Respiratory: Effort normal. No respiratory distress.  GI: Soft.  Musculoskeletal: Normal range of motion.  Neurological: He is alert.  Skin: Skin is warm and dry.     Psychiatric: His speech is normal and behavior is normal. Thought content normal. His affect is blunt. Cognition and memory are normal. He expresses impulsivity.    Review of Systems  Constitutional: Negative.   HENT: Negative.   Eyes: Negative.   Respiratory: Negative.   Cardiovascular: Negative.   Gastrointestinal: Negative.   Musculoskeletal: Negative.   Skin: Negative.   Neurological: Negative.   Psychiatric/Behavioral: Positive for substance abuse. Negative for depression, hallucinations, memory loss and suicidal ideas. The patient is not nervous/anxious and does not have insomnia.     Blood pressure 119/82, pulse 85, temperature 98.5 F (36.9 C), temperature source Oral, resp. rate 18, height 6' 1"  (1.854 m), weight 88.5 kg (195 lb), SpO2 100 %.Body mass index is 25.73 kg/m.  General Appearance: Fairly Groomed  Eye Contact:  Fair  Speech:  Clear and Coherent  Volume:  Normal  Mood:  Anxious  Affect:  Appropriate  Thought Process:  Goal Directed  Orientation:  Full (Time, Place, and Person)  Thought Content:  Logical  Suicidal Thoughts:  No  Homicidal Thoughts:  No  Memory:  Immediate;   Good Recent;   Fair Remote;   Fair  Judgement:  Fair  Insight:  Fair  Psychomotor Activity:  Decreased  Concentration:  Concentration: Fair  Recall:  [employer] of Knowledge:  Fair  Language:  Fair  Akathisia:  No  Handed:  Right  AIMS (if indicated):     Assets:  Financial Resources/Insurance Housing Social Support  ADL's:  Intact  Cognition:  WNL  Sleep:        Treatment Plan Summary: Plan 30 year old man with a history of behavior problems as a child but no adult psychiatric history. He had a  blood alcohol level over 300 when he came in last night. Very superficial cut not consistent with suicidality. Patient's affect and mood are calm today. There is no indication of acute alcohol withdrawal and no delirium. Patient does not meet commitment criteria. Does not require inpatient psychiatric treatment. Counseling about alcohol abuse completed. Encouraged him to think seriously about his alcohol use problem. He will be given referral information to [facility]. Case reviewed with emergency room physician and TTS. Patient can be released from the emergency room.  Disposition: Patient does not meet criteria for psychiatric inpatient admission. Supportive therapy provided about ongoing stressors.  Alethia Berthold, MD 08/13/2016 4:22 PM
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
## 2016-08-13 NOTE — ED Notes (Signed)
ED BHU PLACEMENT JUSTIFICATION Is the patient under IVC or is there intent for IVC: No. Is the patient medically cleared: Yes.   Is there vacancy in the ED BHU: Yes.   Is the population mix appropriate for patient: Yes.   Is the patient awaiting placement in inpatient or outpatient setting: No. Has the patient had a psychiatric consult: No. Survey of unit performed for contraband, proper placement and condition of furniture, tampering with fixtures in bathroom, shower, and each patient room: Yes.  ; Findings: NA APPEARANCE/BEHAVIOR calm, cooperative and adequate rapport can be established NEURO ASSESSMENT Orientation: time, place and person Hallucinations: No.None noted (Hallucinations) Speech: Normal Gait: normal RESPIRATORY ASSESSMENT Normal expansion.  Clear to auscultation.  No rales, rhonchi, or wheezing. CARDIOVASCULAR ASSESSMENT regular rate and rhythm, S1, S2 normal, no murmur, click, rub or gallop GASTROINTESTINAL ASSESSMENT soft, nontender, BS WNL, no r/g EXTREMITIES normal strength, tone, and muscle mass PLAN OF CARE Provide calm/safe environment. Vital signs assessed twice daily. ED BHU Assessment once each 12-hour shift. Collaborate with intake RN daily or as condition indicates. Assure the ED provider has rounded once each shift. Provide and encourage hygiene. Provide redirection as needed. Assess for escalating behavior; address immediately and inform ED provider.  Assess family dynamic and appropriateness for visitation as needed: Yes.  ; If necessary, describe findings: NA Educate the patient/family about BHU procedures/visitation: Yes.  ; If necessary, describe findings: NA  

## 2016-10-08 ENCOUNTER — Emergency Department
Admission: EM | Admit: 2016-10-08 | Discharge: 2016-10-09 | Discharge status: Against Medical Advice | Payer: Medicaid Other | Attending: Emergency Medicine | Admitting: Emergency Medicine

## 2016-10-08 ENCOUNTER — Encounter: Payer: Self-pay | Admitting: Emergency Medicine

## 2016-10-08 DIAGNOSIS — Y92009 Unspecified place in unspecified non-institutional (private) residence as the place of occurrence of the external cause: Secondary | ICD-10-CM | POA: Insufficient documentation

## 2016-10-08 DIAGNOSIS — R6884 Jaw pain: Secondary | ICD-10-CM | POA: Diagnosis present

## 2016-10-08 DIAGNOSIS — F1490 Cocaine use, unspecified, uncomplicated: Secondary | ICD-10-CM | POA: Insufficient documentation

## 2016-10-08 DIAGNOSIS — S60511A Abrasion of right hand, initial encounter: Secondary | ICD-10-CM | POA: Diagnosis not present

## 2016-10-08 DIAGNOSIS — Y999 Unspecified external cause status: Secondary | ICD-10-CM | POA: Diagnosis not present

## 2016-10-08 DIAGNOSIS — Y9389 Activity, other specified: Secondary | ICD-10-CM | POA: Diagnosis not present

## 2016-10-08 DIAGNOSIS — Z79899 Other long term (current) drug therapy: Secondary | ICD-10-CM | POA: Diagnosis not present

## 2016-10-08 DIAGNOSIS — F1290 Cannabis use, unspecified, uncomplicated: Secondary | ICD-10-CM | POA: Diagnosis not present

## 2016-10-08 DIAGNOSIS — J0140 Acute pansinusitis, unspecified: Secondary | ICD-10-CM | POA: Diagnosis not present

## 2016-10-08 DIAGNOSIS — F17210 Nicotine dependence, cigarettes, uncomplicated: Secondary | ICD-10-CM | POA: Insufficient documentation
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
## 2016-10-09 ENCOUNTER — Telehealth: Payer: Self-pay | Admitting: Emergency Medicine

## 2016-10-09 ENCOUNTER — Emergency Department: Payer: Medicaid Other

## 2016-10-09 NOTE — Telephone Encounter (Addendum)
Called patient at request of dr sung to inform of ct result-pansinusitis.  She would like levaquin 750 mg po once daily for 7 days called to patient's pharmacy.  Person who shares phone with patient is not with him.  I will call back later when patient has the phone.   Spoke with patient.  Called rx to [facility] [address].
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
## 2016-10-09 NOTE — ED Notes (Signed)
Right palm abrasion clensed with NS and large bandaid applied

## 2016-10-09 NOTE — ED Notes (Signed)
Pt requesting to leave AMA due to not wanting to wait for results of CT. Pt states needs to get home for family reason. Dr. Dolores FrameSung aware. Patient discharge home in stable condition per Dr Dolores FrameSung . No acute distress noted at time of leaving.

## 2018-02-12 ENCOUNTER — Emergency Department
Admission: EM | Admit: 2018-02-12 | Discharge: 2018-02-12 | Disposition: A | Discharge status: Home / Self Care | Payer: Medicaid Other | Attending: Emergency Medicine | Admitting: Emergency Medicine

## 2018-02-12 ENCOUNTER — Other Ambulatory Visit: Payer: Self-pay

## 2018-02-12 DIAGNOSIS — F17210 Nicotine dependence, cigarettes, uncomplicated: Secondary | ICD-10-CM | POA: Diagnosis not present

## 2018-02-12 DIAGNOSIS — F1010 Alcohol abuse, uncomplicated: Secondary | ICD-10-CM | POA: Diagnosis not present

## 2018-02-12 DIAGNOSIS — R45851 Suicidal ideations: Secondary | ICD-10-CM | POA: Insufficient documentation

## 2018-02-12 DIAGNOSIS — F10920 Alcohol use, unspecified with intoxication, uncomplicated: Secondary | ICD-10-CM | POA: Insufficient documentation

## 2018-02-12 DIAGNOSIS — F1994 Other psychoactive substance use, unspecified with psychoactive substance-induced mood disorder: Secondary | ICD-10-CM | POA: Diagnosis present

## 2018-02-12 LAB — COMPREHENSIVE METABOLIC PANEL
ALBUMIN: 4.2 g/dL (ref 3.5–5.0)
ALT: 25 U/L (ref 17–63)
AST: 26 U/L (ref 15–41)
Alkaline Phosphatase: 55 U/L (ref 38–126)
Anion gap: 11 (ref 5–15)
CHLORIDE: 103 mmol/L (ref 101–111)
CO2: 22 mmol/L (ref 22–32)
Calcium: 8.7 mg/dL — ABNORMAL LOW (ref 8.9–10.3)
Creatinine, Ser: 0.62 mg/dL (ref 0.61–1.24)
GFR calc Af Amer: >60 mL/min (ref >60)
GLUCOSE: 110 mg/dL — AB (ref 65–99)
POTASSIUM: 3.2 mmol/L — AB (ref 3.5–5.1)
SODIUM: 136 mmol/L (ref 135–145)
Total Bilirubin: 0.7 mg/dL (ref 0.3–1.2)
Total Protein: 7.1 g/dL (ref 6.5–8.1)

## 2018-02-12 LAB — URINE DRUG SCREEN, QUALITATIVE (ARMC ONLY)
AMPHETAMINES, UR SCREEN: NOT DETECTED
BENZODIAZEPINE, UR SCRN: NOT DETECTED
Barbiturates, Ur Screen: NOT DETECTED
CANNABINOID 50 NG, UR ~~LOC~~: NOT DETECTED
Cocaine Metabolite,Ur ~~LOC~~: NOT DETECTED
MDMA (Ecstasy)Ur Screen: NOT DETECTED
Methadone Scn, Ur: NOT DETECTED
Opiate, Ur Screen: NOT DETECTED
PHENCYCLIDINE (PCP) UR S: NOT DETECTED
Tricyclic, Ur Screen: NOT DETECTED

## 2018-02-12 LAB — URINALYSIS, COMPLETE (UACMP) WITH MICROSCOPIC
Bacteria, UA: NONE SEEN
Bilirubin Urine: NEGATIVE
GLUCOSE, UA: NEGATIVE mg/dL
Hgb urine dipstick: NEGATIVE
KETONES UR: NEGATIVE mg/dL
LEUKOCYTES UA: NEGATIVE
Nitrite: NEGATIVE
PROTEIN: NEGATIVE mg/dL
RBC / HPF: NONE SEEN RBC/hpf (ref 0–5)
SQUAMOUS EPITHELIAL / LPF: NONE SEEN
Specific Gravity, Urine: 1.001 — ABNORMAL LOW (ref 1.005–1.030)
WBC, UA: NONE SEEN WBC/hpf (ref 0–5)
pH: 6 (ref 5.0–8.0)

## 2018-02-12 LAB — ETHANOL: ALCOHOL ETHYL (B): 291 mg/dL — AB (ref <10)

## 2018-02-12 LAB — CBC
HCT: 52.2 % — ABNORMAL HIGH (ref 40.0–52.0)
Hemoglobin: 18.3 g/dL — ABNORMAL HIGH (ref 13.0–18.0)
MCH: 32.9 pg (ref 26.0–34.0)
MCHC: 35.1 g/dL (ref 32.0–36.0)
MCV: 93.8 fL (ref 80.0–100.0)
PLATELETS: 218 10*3/uL (ref 150–440)
RBC: 5.57 MIL/uL (ref 4.40–5.90)
RDW: 14.2 % (ref 11.5–14.5)
WBC: 11 10*3/uL — AB (ref 3.8–10.6)

## 2018-02-12 NOTE — ED Notes (Signed)
SOC recommends patient, remain on IVC until patient is sober and can have reevaluation in the morning.

## 2018-02-12 NOTE — ED Notes (Signed)

## 2018-02-12 NOTE — ED Triage Notes (Signed)
Pt brought in under IVC for having suicidal thoughts. Pt calm and cooperative at this time.

## 2018-02-12 NOTE — ED Notes (Signed)
Wife/girlfriend returned call.

## 2018-02-12 NOTE — ED Notes (Signed)
Pt. To BHU from ED ambulatory without difficulty, to room  BHU 2. Report from Matt RN. Pt. Is alert and oriented, warm and dry in no distress. Pt. Denies SI, HI, and AVH. Pt. Calm and cooperative. Pt. Made aware of security cameras and Q15 minute rounds. Pt. Encouraged to let Nursing staff know of any concerns or needs.   

## 2018-02-12 NOTE — ED Notes (Signed)
Pt. Requested that his wife(Kimberly Wells) to be called [phone] to let her know he is safe.

## 2018-02-12 NOTE — ED Notes (Signed)
Phone call provided to his room - NAD assessed  Continue to monitor

## 2018-02-12 NOTE — ED Notes (Signed)
BEHAVIORAL HEALTH ROUNDING Patient sleeping: No. Patient alert and oriented: yes Behavior appropriate: Yes.  ; If no, describe:  Nutrition and fluids offered: yes Toileting and hygiene offered: Yes  Sitter present: q15 minute observations and security  monitoring Law enforcement present: Yes  ODS  

## 2018-02-12 NOTE — ED Notes (Signed)
Pt. States he has been drinking alcohol tonight, denies illicit drugs.  Pt. Denies SI/HI at this time.  Pt. Calm and cooperative at this time.

## 2018-02-12 NOTE — ED Notes (Signed)
Charge nurse, security and ED doctor made aware of patients desire to leave.  Dr. Manson Passey talked with patient, patient agreed to talk to [facility] psychiatrist.
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
## 2018-02-12 NOTE — ED Notes (Signed)
[facility]

## 2018-02-12 NOTE — Consult Note (Signed)
[facility] Consult   Reason for Consult: Consult for 32 year old man with a history of alcohol abuse brought in by local authorities after making suicidal statement Referring Physician: Jimmye Norman Patient Identification: Kevin Duarte MRN:  [id number] Principal Diagnosis: Alcohol abuse Diagnosis:   Patient Active Problem List   Diagnosis Date Noted  . Substance induced mood disorder ([place]) [F19.94] 08/13/2016  . Alcohol abuse [F10.10] 08/13/2016  . Self-inflicted laceration of wrist [S61.519A] 08/13/2016    Total Time spent with patient: 1 hour  Subjective:   Kevin Duarte is a 32 y.o. male patient admitted with "I really just wanted to talk to someone".  HPI: Patient interviewed chart reviewed.  32 year old man says that yesterday he was feeling stressed by multiple problems in his life.  He cites his financial difficulties, the pressure of taking care of his family, work all being stresses on him.  He says he just wanted to get some one to talk to so he called mobile crisis.  Apparently he made comments to them that suggested he might be suicidal and they sent police instead.  Patient denies having made any attempt to harm himself and denies having had any recent thoughts about wanting to die or wanting to harm himself.  Admits that he was drinking yesterday.  Blood alcohol level almost 300.  He claims that he only drinks about once a week.  Denies that he is using any other drugs.  Says that most of the time his mood is pretty good just a little nervous.  Denies sleep or appetite problems.  Denies any psychotic symptoms.  Not currently getting any mental health treatment.  Social history: Patient says he lives with his wife and has 4 children.  Works at [employer].  Medical history: Past history of cuts to his forearm but no other significant ongoing medical problems no new injuries.  Substance abuse history: Years of intermittent alcohol abuse.  Patient denies any history  of withdrawal problems.  Has never been in any kind of treatment.  Claims that he only drinks about once a week but when he does he tends to binge.  Denies that he is using any other drugs.  Past Psychiatric History: Patient has been seen in the emergency room previously when he cut himself on the wrist while intoxicated.  No other history known of psychiatric hospitalization.  Not on any psychiatric medicine.  Not following up with any outpatient care no history of psychosis.  Risk to Self: Is patient at risk for suicide?: No Risk to Others:   Prior Inpatient Therapy:   Prior Outpatient Therapy:    Past Medical History: No past medical history on file.  Past Surgical History:  Procedure Laterality Date  . MANDIBLE SURGERY     Left Mandible  . TONSILLECTOMY     Family History: No family history on file. Family Psychiatric  History: Denies any Social History:  Social History   Substance and Sexual Activity  Alcohol Use Yes     Social History   Substance and Sexual Activity  Drug Use Yes  . Types: Marijuana, Cocaine    Social History   Socioeconomic History  . Marital status: Married    Spouse name: Not on file  . Number of children: Not on file  . Years of education: Not on file  . Highest education level: Not on file  Social Needs  . Financial resource strain: Not on file  . Food insecurity - worry: Not on file  .  Food insecurity - inability: Not on file  . Transportation needs - medical: Not on file  . Transportation needs - non-medical: Not on file  Occupational History  . Not on file  Tobacco Use  . Smoking status: Current Every Day Smoker    Packs/day: 0.50    Types: Cigarettes  . Smokeless tobacco: Former Network engineer and Sexual Activity  . Alcohol use: Yes  . Drug use: Yes    Types: Marijuana, Cocaine  . Sexual activity: No  Other Topics Concern  . Not on file  Social History Narrative  . Not on file   Additional Social History:    Allergies:    Allergies  Allergen Reactions  . Amoxicillin Other (See Comments)    reaction: unknown   . Codeine   . Penicillins Other (See Comments)    Reaction: unknown     Labs:  Results for orders placed or performed during the hospital encounter of 02/12/18 (from the past 48 hour(s))  CBC     Status: Abnormal   Collection Time: 02/12/18  1:11 AM  Result Value Ref Range   WBC 11.0 (H) 3.8 - 10.6 K/uL   RBC 5.57 4.40 - 5.90 MIL/uL   Hemoglobin 18.3 (H) 13.0 - 18.0 g/dL   HCT 52.2 (H) 40.0 - 52.0 %   MCV 93.8 80.0 - 100.0 fL   MCH 32.9 26.0 - 34.0 pg   MCHC 35.1 32.0 - 36.0 g/dL   RDW 14.2 11.5 - 14.5 %   Platelets 218 150 - 440 K/uL    Comment: Performed at [facility], [address] [place], [place] [address]  Comprehensive metabolic panel     Status: Abnormal   Collection Time: 02/12/18  1:11 AM  Result Value Ref Range   Sodium 136 135 - 145 mmol/L   Potassium 3.2 (L) 3.5 - 5.1 mmol/L   Chloride 103 101 - 111 mmol/L   CO2 22 22 - 32 mmol/L   Glucose, Bld 110 (H) 65 - 99 mg/dL   BUN <5 (L) 6 - 20 mg/dL   Creatinine, Ser 0.62 0.61 - 1.24 mg/dL   Calcium 8.7 (L) 8.9 - 10.3 mg/dL   Total Protein 7.1 6.5 - 8.1 g/dL   Albumin 4.2 3.5 - 5.0 g/dL   AST 26 15 - 41 U/L   ALT 25 17 - 63 U/L   Alkaline Phosphatase 55 38 - 126 U/L   Total Bilirubin 0.7 0.3 - 1.2 mg/dL   GFR calc non Af Amer >60 >60 mL/min   GFR calc Af Amer >60 >60 mL/min    Comment: (NOTE) The eGFR has been calculated using the CKD EPI equation. This calculation has not been validated in all clinical situations. eGFR's persistently <60 mL/min signify possible Chronic Kidney Disease.    Anion gap 11 5 - 15    Comment: Performed at [facility], [address] [place], [place] [address]  Ethanol     Status: Abnormal   Collection Time: 02/12/18  1:11 AM  Result Value Ref Range   Alcohol, Ethyl (B) 291 (H) <10 mg/dL    Comment:        LOWEST DETECTABLE LIMIT FOR SERUM ALCOHOL IS 10  mg/dL FOR MEDICAL PURPOSES ONLY Performed at [facility], [address] [place], [place] [address]   Urinalysis, Complete w Microscopic     Status: Abnormal   Collection Time: 02/12/18  1:12 AM  Result Value Ref Range   Color, Urine  COLORLESS (A) YELLOW   APPearance CLEAR (A) CLEAR   Specific Gravity, Urine 1.001 (L) 1.005 - 1.030   pH 6.0 5.0 - 8.0   Glucose, UA NEGATIVE NEGATIVE mg/dL   Hgb urine dipstick NEGATIVE NEGATIVE   Bilirubin Urine NEGATIVE NEGATIVE   Ketones, ur NEGATIVE NEGATIVE mg/dL   Protein, ur NEGATIVE NEGATIVE mg/dL   Nitrite NEGATIVE NEGATIVE   Leukocytes, UA NEGATIVE NEGATIVE   RBC / HPF NONE SEEN 0 - 5 RBC/hpf   WBC, UA NONE SEEN 0 - 5 WBC/hpf   Bacteria, UA NONE SEEN NONE SEEN   Squamous Epithelial / LPF NONE SEEN NONE SEEN    Comment: Performed at [facility], [address], [place], [place] [address]  Urine Drug Screen, Qualitative ([facility] only)     Status: None   Collection Time: 02/12/18  1:15 AM  Result Value Ref Range   Tricyclic, Ur Screen NONE DETECTED NONE DETECTED   Amphetamines, Ur Screen NONE DETECTED NONE DETECTED   MDMA (Ecstasy)Ur Screen NONE DETECTED NONE DETECTED   Cocaine Metabolite,Ur [place] NONE DETECTED NONE DETECTED   Opiate, Ur Screen NONE DETECTED NONE DETECTED   Phencyclidine (PCP) Ur S NONE DETECTED NONE DETECTED   Cannabinoid 50 Ng, Ur [place] NONE DETECTED NONE DETECTED   Barbiturates, Ur Screen NONE DETECTED NONE DETECTED   Benzodiazepine, Ur Scrn NONE DETECTED NONE DETECTED   Methadone Scn, Ur NONE DETECTED NONE DETECTED    Comment: (NOTE) Tricyclics + metabolites, urine    Cutoff 1000 ng/mL Amphetamines + metabolites, urine  Cutoff 1000 ng/mL MDMA (Ecstasy), urine              Cutoff 500 ng/mL Cocaine Metabolite, urine          Cutoff 300 ng/mL Opiate + metabolites, urine        Cutoff 300 ng/mL Phencyclidine (PCP), urine         Cutoff 25 ng/mL Cannabinoid, urine                 Cutoff 50  ng/mL Barbiturates + metabolites, urine  Cutoff 200 ng/mL Benzodiazepine, urine              Cutoff 200 ng/mL Methadone, urine                   Cutoff 300 ng/mL The urine drug screen provides only a preliminary, unconfirmed analytical test result and should not be used for non-medical purposes. Clinical consideration and professional judgment should be applied to any positive drug screen result due to possible interfering substances. A more specific alternate chemical method must be used in order to obtain a confirmed analytical result. Gas chromatography / mass spectrometry (GC/MS) is the preferred confirmat ory method. Performed at [facility], [address] [place], [place] [address]     No current facility-administered medications for this encounter.    Current Outpatient Medications  Medication Sig Dispense Refill  . oxyCODONE-acetaminophen (ROXICET) 5-325 MG per tablet Take 1-2 tablets by mouth every 6 (six) hours as needed for severe pain. (Patient not taking: Reported on 02/12/2018) 5 tablet 0    Musculoskeletal: Strength & Muscle Tone: within normal limits Gait & Station: normal Patient leans: N/A  Psychiatric Specialty Exam: Physical Exam  Nursing note and vitals reviewed. Constitutional: He appears well-developed and well-nourished.  HENT:  Head: Normocephalic and atraumatic.  Eyes: Conjunctivae are normal. Pupils are equal, round, and reactive to light.  Neck: Normal range of motion.  Cardiovascular: Regular rhythm  and normal heart sounds.  Respiratory: Effort normal. No respiratory distress.  GI: Soft.  Musculoskeletal: Normal range of motion.  Neurological: He is alert.  Skin: Skin is warm and dry.  Psychiatric: He has a normal mood and affect. Judgment normal. His speech is delayed. He is slowed. Thought content is not paranoid. Cognition and memory are normal. He expresses no homicidal and no suicidal ideation.    Review of Systems   Constitutional: Negative.   HENT: Negative.   Eyes: Negative.   Respiratory: Negative.   Cardiovascular: Negative.   Gastrointestinal: Negative.   Musculoskeletal: Negative.   Skin: Negative.   Neurological: Negative.   Psychiatric/Behavioral: Positive for substance abuse. Negative for depression, hallucinations, memory loss and suicidal ideas. The patient is nervous/anxious. The patient does not have insomnia.     Blood pressure 134/88, pulse 85, temperature 98 F (36.7 C), resp. rate 18, height _0  (1.854 m), weight 86.2 kg (190 lb), SpO2 98 %.Body mass index is 25.07 kg/m.  General Appearance: Casual  Eye Contact:  Fair  Speech:  Slow  Volume:  Decreased  Mood:  Euthymic  Affect:  Constricted  Thought Process:  Goal Directed  Orientation:  Full (Time, Place, and Person)  Thought Content:  Logical  Suicidal Thoughts:  No  Homicidal Thoughts:  No  Memory:  Immediate;   Fair Recent;   Fair Remote;   Fair  Judgement:  Impaired  Insight:  Present  Psychomotor Activity:  Decreased  Concentration:  Concentration: Fair  Recall:  [employer] of Knowledge:  Fair  Language:  Fair  Akathisia:  No  Handed:  Right  AIMS (if indicated):     Assets:  Desire for Improvement Housing Physical Health Resilience  ADL's:  Intact  Cognition:  WNL  Sleep:        Treatment Plan Summary: Plan 32 year old man who has now sobered up.  Normal affect.  Denies suicidal thoughts.  No evidence of psychosis.  No longer meets commitment criteria.  Discontinued involuntary commitment.  Patient strongly encouraged to assess his drinking problem more seriously and to consider going to [facility] for outpatient treatment.  No other need for medication or treatment at this time.  Case reviewed with TTS and emergency room physician.  Patient can be released from the emergency room.  Disposition: No evidence of imminent risk to self or others at present.   Patient does not meet criteria for psychiatric  inpatient admission. Supportive therapy provided about ongoing stressors.  Alethia Berthold, MD 02/12/2018 3:19 PM

## 2018-02-12 NOTE — ED Notes (Signed)
ED  Is the patient under IVC or is there intent for IVC: Yes.   Is the patient medically cleared: Yes.   Is there vacancy in the ED BHU: Yes.   Is the population mix appropriate for patient: Yes.   Is the patient awaiting placement in inpatient or outpatient setting: awaiting inpt bed to be assigned     Has the patient had a psychiatric consult: Yes.   Survey of unit performed for contraband, proper placement and condition of furniture, tampering with fixtures in bathroom, shower, and each patient room: Yes.  ; Findings:  APPEARANCE/BEHAVIOR Calm and cooperative NEURO ASSESSMENT Orientation: oriented x4 Denies pain Hallucinations: No.None noted (Hallucinations) denies  Speech: Normal  Gait: normal RESPIRATORY ASSESSMENT Even  Unlabored respirations  CARDIOVASCULAR ASSESSMENT Pulses equal   regular rate  Skin warm and dry   GASTROINTESTINAL ASSESSMENT no GI complaint EXTREMITIES Full ROM  PLAN OF CARE Provide calm/safe environment. Vital signs assessed twice daily. ED BHU Assessment once each 12-hour shift. Collaborate with TTS daily or as condition indicates. Assure the ED provider has rounded once each shift. Provide and encourage hygiene. Provide redirection as needed. Assess for escalating behavior; address immediately and inform ED provider.  Assess family dynamic and appropriateness for visitation as needed: Yes.  ; If necessary, describe findings:  Educate the patient/family about BHU procedures/visitation: Yes.  ; If necessary, describe findings:

## 2018-02-12 NOTE — ED Provider Notes (Signed)
IVC was rescinded by psychiatry.   Kevin FilbertWilliams, [name] E, MD 02/12/18 [phone]

## 2018-02-12 NOTE — ED Notes (Signed)
Dr Clapacs rescinded IVC papers. 

## 2018-02-12 NOTE — ED Notes (Signed)
Pt. States he does not believe he is under IVC.  Pt. States he wants to walk out.  Pt. Told he can not leave hospital while under IVC.  Pt. States he does not want to lose his job.  Pt. Told he can call work and let them know he is in the Emergency department.  Pt. Also told we can give work excuse.

## 2018-03-05 ENCOUNTER — Other Ambulatory Visit: Payer: Self-pay

## 2018-03-05 ENCOUNTER — Encounter: Payer: Self-pay | Admitting: Emergency Medicine

## 2018-03-05 ENCOUNTER — Emergency Department
Admission: EM | Admit: 2018-03-05 | Discharge: 2018-03-05 | Disposition: A | Discharge status: Home / Self Care | Payer: Medicaid Other | Attending: Emergency Medicine | Admitting: Emergency Medicine

## 2018-03-05 DIAGNOSIS — R112 Nausea with vomiting, unspecified: Secondary | ICD-10-CM | POA: Diagnosis present

## 2018-03-05 DIAGNOSIS — K047 Periapical abscess without sinus: Secondary | ICD-10-CM | POA: Diagnosis not present

## 2018-03-05 DIAGNOSIS — F17210 Nicotine dependence, cigarettes, uncomplicated: Secondary | ICD-10-CM | POA: Insufficient documentation

## 2018-03-05 LAB — COMPREHENSIVE METABOLIC PANEL
ALBUMIN: 3.8 g/dL (ref 3.5–5.0)
ALK PHOS: 43 U/L (ref 38–126)
ALT: 19 U/L (ref 17–63)
AST: 26 U/L (ref 15–41)
Anion gap: 8 (ref 5–15)
BUN: 7 mg/dL (ref 6–20)
CALCIUM: 8.8 mg/dL — AB (ref 8.9–10.3)
CHLORIDE: 101 mmol/L (ref 101–111)
CO2: 27 mmol/L (ref 22–32)
CREATININE: 0.77 mg/dL (ref 0.61–1.24)
GFR calc Af Amer: >60 mL/min (ref >60)
GFR calc non Af Amer: >60 mL/min (ref >60)
GLUCOSE: 117 mg/dL — AB (ref 65–99)
Potassium: 3.7 mmol/L (ref 3.5–5.1)
SODIUM: 136 mmol/L (ref 135–145)
Total Bilirubin: 1.2 mg/dL (ref 0.3–1.2)
Total Protein: 6.1 g/dL — ABNORMAL LOW (ref 6.5–8.1)

## 2018-03-05 LAB — CBC WITH DIFFERENTIAL/PLATELET
BASOS PCT: 0 %
Basophils Absolute: 0 10*3/uL (ref 0–0.1)
Eosinophils Absolute: 0 10*3/uL (ref 0–0.7)
Eosinophils Relative: 0 %
HEMATOCRIT: 48.2 % (ref 40.0–52.0)
Hemoglobin: 16.5 g/dL (ref 13.0–18.0)
LYMPHS ABS: 0.6 10*3/uL — AB (ref 1.0–3.6)
Lymphocytes Relative: 6 %
MCH: 32.4 pg (ref 26.0–34.0)
MCHC: 34.2 g/dL (ref 32.0–36.0)
MCV: 94.8 fL (ref 80.0–100.0)
MONO ABS: 0.6 10*3/uL (ref 0.2–1.0)
MONOS PCT: 7 %
NEUTROS ABS: 8.1 10*3/uL — AB (ref 1.4–6.5)
Neutrophils Relative %: 87 %
Platelets: 147 10*3/uL — ABNORMAL LOW (ref 150–440)
RBC: 5.08 MIL/uL (ref 4.40–5.90)
RDW: 13.7 % (ref 11.5–14.5)
WBC: 9.3 10*3/uL (ref 3.8–10.6)

## 2018-03-05 LAB — URINALYSIS, COMPLETE (UACMP) WITH MICROSCOPIC
BACTERIA UA: NONE SEEN
BILIRUBIN URINE: NEGATIVE
Glucose, UA: NEGATIVE mg/dL
Hgb urine dipstick: NEGATIVE
Ketones, ur: 5 mg/dL — AB
Leukocytes, UA: NEGATIVE
NITRITE: NEGATIVE
PH: 7 (ref 5.0–8.0)
Protein, ur: NEGATIVE mg/dL
Specific Gravity, Urine: 1.02 (ref 1.005–1.030)

## 2018-03-05 LAB — INFLUENZA PANEL BY PCR (TYPE A & B)
INFLAPCR: NEGATIVE
INFLBPCR: NEGATIVE

## 2018-03-05 LAB — LACTIC ACID, PLASMA: Lactic Acid, Venous: 1.4 mmol/L (ref 0.5–1.9)

## 2018-03-05 MED ORDER — ONDANSETRON HCL 4 MG/2ML IJ SOLN
4.0000 mg | Freq: Once | INTRAMUSCULAR | Status: AC
  Administered 2018-03-05: 4 mg via INTRAVENOUS
  Filled 2018-03-05: qty 2

## 2018-03-05 MED ORDER — SODIUM CHLORIDE 0.9 % IV SOLN
Freq: Once | INTRAVENOUS | Status: AC
  Administered 2018-03-05: 15:00:00 via INTRAVENOUS

## 2018-03-05 MED ORDER — CLINDAMYCIN PHOSPHATE 600 MG/50ML IV SOLN
600.0000 mg | Freq: Once | INTRAVENOUS | Status: AC
  Administered 2018-03-05: 600 mg via INTRAVENOUS
  Filled 2018-03-05: qty 50

## 2018-03-05 MED ORDER — CLINDAMYCIN HCL 300 MG PO CAPS: 300.0000 mg | ORAL_CAPSULE | Freq: Three times a day (TID) | ORAL | 0 refills | Status: AC

## 2018-03-05 MED ORDER — ONDANSETRON 4 MG PO TBDP: 4.0000 mg | ORAL_TABLET | Freq: Three times a day (TID) | ORAL | 0 refills | Status: AC | PRN

## 2018-03-05 MED ORDER — ACETAMINOPHEN 325 MG PO TABS
650.0000 mg | ORAL_TABLET | Freq: Once | ORAL | Status: AC | PRN
  Administered 2018-03-05: 650 mg via ORAL
  Filled 2018-03-05: qty 2

## 2018-03-05 NOTE — ED Triage Notes (Addendum)
Here for dental pain and oral swelling from this. Has appt to get tooth pulled next Monday.  Started with body aches.  Febrile in triage.  Mild swelling noted to left cheek.  All sx started with tooth pain but daughter did have flu. Has had vomiting.

## 2018-03-05 NOTE — ED Notes (Signed)
Pt able to ambulate to treatment room independently and is in NAD at this time. Pt in treatment room with lights dimmed bed in lowest position and locked and call bell in reach.  

## 2018-03-05 NOTE — ED Provider Notes (Signed)
[facility] Emergency Department Provider Note       Time seen: ----------------------------------------- 3:48 PM on 03/05/2018 -----------------------------------------   I have reviewed the triage vital signs and the nursing notes.  HISTORY   Chief Complaint Fever and Emesis    HPI Kevin Duarte is a 32 y.o. male with a history of mood disorder, alcohol abuse who presents to the ED for dental pain and oral swelling from this.  Patient states he has an appointment to have his tooth pulled on Monday.  He started with body aches and was noted to be febrile here.  He was noted to have some left-sided facial swelling as well.  Daughter recently has the flu, he has had some vomiting as well.  History reviewed. No pertinent past medical history.  Patient Active Problem List   Diagnosis Date Noted  . Substance induced mood disorder ([facility]) 08/13/2016  . Alcohol abuse 08/13/2016  . Self-inflicted laceration of wrist 08/13/2016    Past Surgical History:  Procedure Laterality Date  . MANDIBLE SURGERY     Left Mandible  . TONSILLECTOMY      Allergies Amoxicillin; Codeine; and Penicillins  Social History Social History   Tobacco Use  . Smoking status: Current Every Day Smoker    Packs/day: 0.50    Types: Cigarettes  . Smokeless tobacco: Former Engineer, waterUser  Substance Use Topics  . Alcohol use: Yes  . Drug use: Yes    Types: Marijuana, Cocaine    Review of Systems Constitutional: Positive for fever ENT: Positive for dental pain Cardiovascular: Negative for chest pain. Respiratory: Negative for shortness of breath. Gastrointestinal: Negative for abdominal pain, positive for vomiting Musculoskeletal: Negative for back pain. Skin: Negative for rash. Neurological: Negative for headaches, focal weakness or numbness.  All systems negative/normal/unremarkable except as stated in the HPI  ____________________________________________   PHYSICAL  EXAM:  VITAL SIGNS: ED Triage Vitals [03/05/18 1124]  Enc Vitals Group     BP 131/87     Pulse Rate (!) 112     Resp 16     Temp (!) 102.9 F (39.4 C)     Temp Source Oral     SpO2 98 %     Weight 192 lb (87.1 kg)     Height 6\' 1"  (1.854 m)     Head Circumference      Peak Flow      Pain Score 8     Pain Loc      Pain Edu?      Excl. in GC?    Constitutional: Alert and oriented. Well appearing and in no distress. Eyes: Conjunctivae are normal. Normal extraocular movements. ENT   Head: Normocephalic and atraumatic.  Left-sided facial swelling and flattened nasolabial fold   Nose: No congestion/rhinnorhea.   Mouth/Throat: Mucous membranes are moist.  Widespread tooth decay, likely periapical abscess around tooth #10.   Neck: No stridor. Cardiovascular: Normal rate, regular rhythm. No murmurs, rubs, or gallops. Respiratory: Normal respiratory effort without tachypnea nor retractions. Breath sounds are clear and equal bilaterally. No wheezes/rales/rhonchi. Gastrointestinal: Soft and nontender. Normal bowel sounds Musculoskeletal: Nontender with normal range of motion in extremities. No lower extremity tenderness nor edema. Neurologic:  Normal speech and language. No gross focal neurologic deficits are appreciated.  Skin:  Skin is warm, dry and intact. No rash noted. Psychiatric: Mood and affect are normal. Speech and behavior are normal.  ____________________________________________  ED COURSE:  As part of my medical decision making, I reviewed the  following data within the electronic MEDICAL RECORD NUMBER History obtained from family if available, nursing notes, old chart and ekg, as well as notes from prior ED visits. Patient presented for fever and toothache, we will assess with labs and imaging as indicated at this time.   Procedures ____________________________________________   LABS (pertinent positives/negatives)  Labs Reviewed  COMPREHENSIVE METABOLIC PANEL  - Abnormal; Notable for the following components:      Result Value   Glucose, Bld 117 (*)    Calcium 8.8 (*)    Total Protein 6.1 (*)    All other components within normal limits  CBC WITH DIFFERENTIAL/PLATELET - Abnormal; Notable for the following components:   Platelets 147 (*)    Neutro Abs 8.1 (*)    Lymphs Abs 0.6 (*)    All other components within normal limits  URINALYSIS, COMPLETE (UACMP) WITH MICROSCOPIC - Abnormal; Notable for the following components:   Color, Urine YELLOW (*)    APPearance CLEAR (*)    Ketones, ur 5 (*)    Squamous Epithelial / LPF 0-5 (*)    All other components within normal limits  LACTIC ACID, PLASMA  INFLUENZA PANEL BY PCR (TYPE A & B)  LACTIC ACID, PLASMA  ___________________________________________  DIFFERENTIAL DIAGNOSIS   Dental abscess, dental caries, influenza, sepsis  FINAL ASSESSMENT AND PLAN  Dental abscess, vomiting   Plan: The patient had presented for a dental abscess. Patient's labs were overall reassuring.  He was given IV fluids as well as IV Zofran and IV clindamycin for his dental abscess.  He be discharged with antibiotics and antiemetics and is cleared for outpatient dental referral.   Ulice Dash, MD   Note: This note was generated in part or whole with voice recognition software. Voice recognition is usually quite accurate but there are transcription errors that can and very often do occur. I apologize for any typographical errors that were not detected and corrected.     Emily Filbert, MD 03/05/18 [phone]

## 2021-04-11 ENCOUNTER — Telehealth: Payer: Medicaid Other | Admitting: Nurse Practitioner

## 2021-04-11 ENCOUNTER — Encounter: Payer: Self-pay | Admitting: Physician Assistant

## 2021-04-11 ENCOUNTER — Encounter: Payer: Self-pay | Admitting: Nurse Practitioner

## 2021-04-11 ENCOUNTER — Telehealth: Payer: Medicaid Other | Admitting: Physician Assistant

## 2021-04-11 DIAGNOSIS — Z91199 Patient's noncompliance with other medical treatment and regimen due to unspecified reason: Secondary | ICD-10-CM

## 2021-04-11 DIAGNOSIS — R109 Unspecified abdominal pain: Secondary | ICD-10-CM

## 2021-04-11 DIAGNOSIS — R0781 Pleurodynia: Secondary | ICD-10-CM

## 2021-04-11 DIAGNOSIS — Z5329 Procedure and treatment not carried out because of patient's decision for other reasons: Secondary | ICD-10-CM

## 2021-04-11 NOTE — Progress Notes (Signed)
Patient kept attempting to get on without success.  I have called him x 3 and unable to leave a VM. It states call cannot go through.  He has listed as CC as hurt ribs and severe pain.   I will send through AVS on mychart for him to seek in person evaluation at local Urgent Care.

## 2021-04-11 NOTE — Patient Instructions (Signed)
Kevin Duarte,  We have been unable to connect with you tonight. However, your complaint is not something we would be able to treat through the digital health platform.   I do recommend for you to seek in person evaluation at the nearest Urgent Care. Below I will provide a list of Urgent Cares available through [place]:    For an urgent face to face visit, [place] has six urgent care centers for your convenience:     [facility] at [facility] Directions [phone] [address] [place], [place] [address] . 8 am - 4 pm Monday - Friday    [facility] Urgent Care Center [facility]) Get Driving Directions [phone] [address] [place], [place] [address] . 8 am to 8 pm Monday-Friday . 10 am to 6 pm [facility] Urgent [facility]) Get Driving Directions [phone]  [address] [place],  [place]  [address] . 8 am to 8 pm Monday-Friday . 8 am to 4 pm [facility] Urgent Care at [facility] Get Driving Directions [phone] [address] [place], [place] [address] . 8 am to 8 pm Monday-Friday . 8 am to 4 pm [facility] Urgent Care at [facility] Get Driving Directions  [phone] [address] [place], [place] [address] . 8 am to 8 pm Monday-Friday . 8 am to 4 pm [facility] Urgent Care at [facility] Directions [phone] [address] [place], [place] [address] . 8 am to 8 pm Monday-Friday . 8 am to 4 pm Saturday-Sunday  I do apologize for any inconvenience,  Daiva Nakayama, PA-C

## 2021-04-11 NOTE — Progress Notes (Signed)
Patient never connected to visit. Attempted to call but was not abe to get an answer. Was not able to leave voice mail.

## 2021-04-11 NOTE — Progress Notes (Signed)
Based on what you shared with me it looks like you have right flank pain with pain on breathing,that should be evaluated in a face to face office visit. You will need a face to face visit for proper treatment. There are several things this could be, and cannot be diagnosed properly in an e visit.    NOTE: If you entered your credit card information for this eVisit, you will not be charged. You may see a "hold" on your card for the $35 but that hold will drop off and you will not have a charge processed.  If you are having a true medical emergency please call 911.     For an urgent face to face visit, [place] has four urgent care centers for your convenience:   . [facility]    [phone]                  Get Driving Directions  [address] [place], [place] [address] . 10 am to 8 pm Monday-Friday . 12 pm to 8 pm Saturday-Sunday   . [facility] at [facility]  [phone]                  Get Driving Directions  [address] [place], [place] [address] . 8 am to 8 pm Monday-Friday . 9 am to 6 pm Saturday . 11 am to 6 pm Sunday   . [facility]gent Care at [facility]  [phone]                  Get Driving Directions   [address] [place], [place] [address] . 8 am to 8 pm Monday-Friday . 8 am to 4 pm Saturday-Sunday    . [facility] Urgent Care at [facility] Directions  [phone]  [address], [address] [place], [place] [address]  . Monday-Friday, 12 PM to 6 PM    Your e-visit answers were reviewed by a board certified advanced clinical practitioner to complete your personal care plan.  Thank you for using e-Visits.

## 2021-04-12 ENCOUNTER — Ambulatory Visit: Payer: Self-pay

## 2023-06-10 ENCOUNTER — Emergency Department
Admission: EM | Admit: 2023-06-10 | Discharge: 2023-06-11 | Disposition: A | Discharge status: Home / Self Care | Payer: Medicaid Other | Attending: Emergency Medicine | Admitting: Emergency Medicine

## 2023-06-10 ENCOUNTER — Encounter: Payer: Self-pay | Admitting: Emergency Medicine

## 2023-06-10 ENCOUNTER — Emergency Department: Payer: Medicaid Other

## 2023-06-10 ENCOUNTER — Other Ambulatory Visit: Payer: Self-pay

## 2023-06-10 DIAGNOSIS — S8991XA Unspecified injury of right lower leg, initial encounter: Secondary | ICD-10-CM | POA: Diagnosis present

## 2023-06-10 DIAGNOSIS — S81811A Laceration without foreign body, right lower leg, initial encounter: Secondary | ICD-10-CM | POA: Insufficient documentation

## 2023-06-10 DIAGNOSIS — W25XXXA Contact with sharp glass, initial encounter: Secondary | ICD-10-CM | POA: Insufficient documentation

## 2023-06-10 DIAGNOSIS — Z23 Encounter for immunization: Secondary | ICD-10-CM | POA: Insufficient documentation

## 2023-06-10 MED ORDER — LIDOCAINE-EPINEPHRINE 2 %-1:100000 IJ SOLN
20.0000 mL | Freq: Once | INTRAMUSCULAR | Status: AC
  Administered 2023-06-10: 20 mL via INTRADERMAL
  Filled 2023-06-10: qty 1

## 2023-06-10 MED ORDER — TETANUS-DIPHTH-ACELL PERTUSSIS 5-2.5-18.5 LF-MCG/0.5 IM SUSY
0.5000 mL | PREFILLED_SYRINGE | Freq: Once | INTRAMUSCULAR | Status: AC
  Administered 2023-06-10: 0.5 mL via INTRAMUSCULAR
  Filled 2023-06-10: qty 0.5

## 2023-06-10 NOTE — ED Triage Notes (Signed)
EMS brings pt for laceration to ankle; st "stepped on some glass"

## 2023-06-10 NOTE — ED Provider Notes (Signed)
[facility] Provider Note    Event Date/Time   First MD Initiated Contact with Patient 06/10/23 2328     (approximate)   History   Extremity Laceration   HPI  Kevin Duarte is a 37 y.o. male here with laceration to the right leg.  The patient states walking past a door that had had broken glass.  He thought he had gotten it off.  He sustained a laceration to his right medial shin.  He states it bled significantly.  Applied a tourniquet and subsequent presents for evaluation.  He is unsure of his last tetanus shot.  Denies any distal numbness weakness.  No other injuries.  He does not believe any piece of glass broke off.     Physical Exam   Triage Vital Signs: ED Triage Vitals  Enc Vitals Group     BP 06/10/23 2214 (!) 138/94     Pulse Rate 06/10/23 2214 (!) 102     Resp 06/10/23 2214 18     Temp 06/10/23 2214 98.4 F (36.9 C)     Temp Source 06/10/23 2214 Oral     SpO2 06/10/23 2214 99 %     Weight 06/10/23 2213 159 lb (72.1 kg)     Height 06/10/23 2213 6\' 1"  (1.854 m)     Head Circumference --      Peak Flow --      Pain Score 06/10/23 2213 4     Pain Loc --      Pain Edu? --      Excl. in GC? --     Most recent vital signs: Vitals:   06/10/23 2214 06/10/23 2345  BP: (!) 138/94 129/89  Pulse: (!) 102 94  Resp: 18 18  Temp: 98.4 F (36.9 C) 98 F (36.7 C)  SpO2: 99% 100%     General: Awake, no distress.  CV:  Good peripheral perfusion.  Resp:  Normal work of breathing.  Abd:  No distention.  Other:  Approximately 6 cm linear laceration to the medial aspect of the right anterior medial shin.  No significant bleeding.  Distal DP and PT pulses 2+ and symmetric.  Distal strength and sensation intact.   ED Results / Procedures / Treatments   Labs (all labs ordered are listed, but only abnormal results are displayed) Labs Reviewed - No data to display   EKG    RADIOLOGY DG tib-fib: No radiopaque foreign body   I also  independently reviewed and agree with radiologist interpretations.   PROCEDURES:  Critical Care performed: No  ..Laceration Repair  Date/Time: 06/11/2023 2:08 AM  Performed by: Shaune Pollack, MD Authorized by: Shaune Pollack, MD   Consent:    Consent obtained:  Verbal   Consent given by:  Patient   Risks, benefits, and alternatives were discussed: yes     Risks discussed:  Infection, need for additional repair, nerve damage, poor wound healing, poor cosmetic result, pain, retained foreign body, tendon damage and vascular damage   Alternatives discussed:  Referral Universal protocol:    Imaging studies available: yes   Anesthesia:    Anesthesia method:  Local infiltration   Local anesthetic:  Lidocaine 2% WITH epi Laceration details:    Location: Right shin/lower leg.   Length (cm):  6 Pre-procedure details:    Preparation:  Patient was prepped and draped in usual sterile fashion Exploration:    Wound exploration: wound explored through full range of motion     Wound  extent: no signs of injury and no vascular damage   Treatment:    Area cleansed with:  Povidone-iodine   Amount of cleaning:  Extensive   Irrigation solution:  Sterile saline   Irrigation method:  Pressure wash Skin repair:    Repair method:  Sutures   Suture size:  4-0   Suture material:  Prolene   Suture technique:  Simple interrupted   Number of sutures:  8 Approximation:    Approximation:  Close Repair type:    Repair type:  Simple Post-procedure details:    Dressing:  Antibiotic ointment   Procedure completion:  Tolerated     MEDICATIONS ORDERED IN ED: Medications  lidocaine-EPINEPHrine (XYLOCAINE W/EPI) 2 %-1:100000 (with pres) injection 20 mL (20 mLs Intradermal Given by Other 06/10/23 2359)  Tdap (BOOSTRIX) injection 0.5 mL (0.5 mLs Intramuscular Given 06/10/23 2357)     IMPRESSION / MDM / ASSESSMENT AND PLAN / ED COURSE  I reviewed the triage vital signs and the nursing notes.                               Differential diagnosis includes, but is not limited to, laceration, retained FB, fx  Patient's presentation is most consistent with acute presentation with potential threat to life or bodily function.  37 year old male here with superficial laceration to the lower leg.  No visible or radiopaque foreign bodies noted.  The wound was thoroughly cleaned and closed with sutures as above.  Tetanus updated.  Will place on prophylactic antibiotics.  Return precautions given.     FINAL CLINICAL IMPRESSION(S) / ED DIAGNOSES   Final diagnoses:  Laceration of right lower extremity, initial encounter     Rx / DC Orders   ED Discharge Orders          Ordered    cephALEXin (KEFLEX) 500 MG capsule  3 times daily        06/11/23 0153             Note:  This document was prepared using Dragon voice recognition software and may include unintentional dictation errors.   Shaune Pollack, MD 06/11/23 [phone]

## 2023-06-10 NOTE — ED Triage Notes (Addendum)
Pt presents via [facility] for a laceration to the R shin that occurred tonight. He notes walking past a door that has some glass sticking out of it causing it to bleeding. Bleeding well controlled with gauze and curlex. A&Ox4 at this time. Denies CP or SOB.

## 2023-06-11 MED ORDER — CEPHALEXIN 500 MG PO CAPS: 500.0000 mg | ORAL_CAPSULE | Freq: Three times a day (TID) | ORAL | 0 refills | Status: AC

## 2023-06-11 NOTE — ED Notes (Addendum)
Pts leg dressed with non-adherent dressing & coban.
